# Patient Record
Sex: Male | Born: 1955 | Race: White | Hispanic: No | Marital: Married | Smoking: Former smoker
Health system: Southern US, Community
[De-identification: ages and names within clinical notes are randomized; demographics above are authoritative.]

## PROBLEM LIST (undated history)

## (undated) DIAGNOSIS — G709 Myoneural disorder, unspecified: Secondary | ICD-10-CM

## (undated) DIAGNOSIS — R112 Nausea with vomiting, unspecified: Secondary | ICD-10-CM

## (undated) DIAGNOSIS — G4730 Sleep apnea, unspecified: Secondary | ICD-10-CM

## (undated) DIAGNOSIS — E119 Type 2 diabetes mellitus without complications: Secondary | ICD-10-CM

## (undated) DIAGNOSIS — Z9889 Other specified postprocedural states: Secondary | ICD-10-CM

## (undated) DIAGNOSIS — M1990 Unspecified osteoarthritis, unspecified site: Secondary | ICD-10-CM

## (undated) DIAGNOSIS — I10 Essential (primary) hypertension: Secondary | ICD-10-CM

## (undated) DIAGNOSIS — K219 Gastro-esophageal reflux disease without esophagitis: Secondary | ICD-10-CM

## (undated) DIAGNOSIS — Z8489 Family history of other specified conditions: Secondary | ICD-10-CM

## (undated) HISTORY — PX: OTHER SURGICAL HISTORY: SHX169

## (undated) HISTORY — PX: BACK SURGERY: SHX140

## (undated) HISTORY — PX: HIP SURGERY: SHX245

---

## 2001-01-21 HISTORY — PX: CHOLECYSTECTOMY: SHX55

## 2002-01-21 HISTORY — PX: NECK SURGERY: SHX720

## 2002-01-21 HISTORY — PX: WRIST SURGERY: SHX841

## 2003-01-22 HISTORY — PX: OTHER SURGICAL HISTORY: SHX169

## 2015-06-21 ENCOUNTER — Other Ambulatory Visit: Payer: Self-pay | Admitting: Neurological Surgery

## 2015-06-21 DIAGNOSIS — M542 Cervicalgia: Secondary | ICD-10-CM

## 2015-06-21 DIAGNOSIS — M545 Low back pain: Secondary | ICD-10-CM

## 2015-06-29 ENCOUNTER — Ambulatory Visit
Admission: RE | Admit: 2015-06-29 | Discharge: 2015-06-29 | Disposition: A | Discharge status: Home / Self Care | Payer: Self-pay | Source: Ambulatory Visit | Attending: Neurological Surgery | Admitting: Neurological Surgery

## 2015-06-29 ENCOUNTER — Ambulatory Visit
Admission: RE | Admit: 2015-06-29 | Discharge: 2015-06-29 | Disposition: A | Discharge status: Home / Self Care | Payer: Medicare HMO | Source: Ambulatory Visit | Attending: Neurological Surgery | Admitting: Neurological Surgery

## 2015-06-29 DIAGNOSIS — M542 Cervicalgia: Secondary | ICD-10-CM

## 2015-06-29 DIAGNOSIS — M545 Low back pain: Secondary | ICD-10-CM

## 2015-06-29 MED ORDER — GADOBENATE DIMEGLUMINE 529 MG/ML IV SOLN
20.0000 mL | Freq: Once | INTRAVENOUS | Status: AC | PRN
  Administered 2015-06-29: 20 mL via INTRAVENOUS

## 2016-02-02 ENCOUNTER — Other Ambulatory Visit: Payer: Self-pay | Admitting: Neurological Surgery

## 2016-03-18 NOTE — Pre-Procedure Instructions (Signed)
    Tasia CatchingsJohnny B Huxtable  03/18/2016      [facility] - [place] - [id number] [place] [id number] Phone: [phone] Fax: [phone]  [facility] [place] - [address] [place] [id number] Phone: [phone] Fax: [phone]    Your procedure is scheduled on Wed, Mar 7 @ 8:30 AM  [facility] Admitting at 6:30 AM  Call this number if you have problems the morning of surgery:  [phone]   Remember:  Do not eat food or drink liquids after midnight.  Take these medicines the morning of surgery with A SIP OF WATER Amlodipine(Norvasc),Gabapentin(Neurontin),Pain Pill(if needed),and Omeprazole(Prilosec)                Stop taking your Mobic along with any Vitamins or Herbal Medications. No Goody's,BC's,Aleve,Advil,Motrin,Ibuprofen,or Fish Oil.    Do not wear jewelry.  Do not wear lotions, powders,colognes, or deoderant.             Men may shave face and neck.  Do not bring valuables to the hospital.  [facility] is not responsible for any belongings or valuables.  Contacts, dentures or bridgework may not be worn into surgery.  Leave your suitcase in the car.  After surgery it may be brought to your room.  For patients admitted to the hospital, discharge time will be determined by your treatment team.  Patients discharged the day of surgery will not be allowed to drive home.    Special instructions:    Please read over the following fact sheets that you were given. Pain Booklet, Coughing and Deep Breathing, MRSA Information and Surgical Site Infection Prevention

## 2016-03-19 ENCOUNTER — Encounter (HOSPITAL_COMMUNITY)
Admission: RE | Admit: 2016-03-19 | Discharge: 2016-03-19 | Disposition: A | Discharge status: Home / Self Care | Payer: Medicare HMO | Source: Ambulatory Visit | Attending: Neurological Surgery | Admitting: Neurological Surgery

## 2016-03-19 ENCOUNTER — Encounter (HOSPITAL_COMMUNITY): Payer: Self-pay

## 2016-03-19 DIAGNOSIS — M4802 Spinal stenosis, cervical region: Secondary | ICD-10-CM | POA: Insufficient documentation

## 2016-03-19 DIAGNOSIS — Z01818 Encounter for other preprocedural examination: Secondary | ICD-10-CM | POA: Insufficient documentation

## 2016-03-19 HISTORY — DX: Essential (primary) hypertension: I10

## 2016-03-19 HISTORY — DX: Sleep apnea, unspecified: G47.30

## 2016-03-19 HISTORY — DX: Unspecified osteoarthritis, unspecified site: M19.90

## 2016-03-19 HISTORY — DX: Nausea with vomiting, unspecified: R11.2

## 2016-03-19 HISTORY — DX: Gastro-esophageal reflux disease without esophagitis: K21.9

## 2016-03-19 HISTORY — DX: Other specified postprocedural states: Z98.890

## 2016-03-19 LAB — CBC WITH DIFFERENTIAL/PLATELET
BASOS ABS: 0 10*3/uL (ref 0.0–0.1)
BASOS PCT: 0 %
Eosinophils Absolute: 0.2 10*3/uL (ref 0.0–0.7)
Eosinophils Relative: 2 %
HEMATOCRIT: 42.1 % (ref 39.0–52.0)
HEMOGLOBIN: 14.5 g/dL (ref 13.0–17.0)
LYMPHS PCT: 36 %
Lymphs Abs: 2.9 10*3/uL (ref 0.7–4.0)
MCH: 29.4 pg (ref 26.0–34.0)
MCHC: 34.4 g/dL (ref 30.0–36.0)
MCV: 85.4 fL (ref 78.0–100.0)
Monocytes Absolute: 0.5 10*3/uL (ref 0.1–1.0)
Monocytes Relative: 6 %
NEUTROS ABS: 4.4 10*3/uL (ref 1.7–7.7)
NEUTROS PCT: 56 %
Platelets: 285 10*3/uL (ref 150–400)
RBC: 4.93 MIL/uL (ref 4.22–5.81)
RDW: 13.7 % (ref 11.5–15.5)
WBC: 8.1 10*3/uL (ref 4.0–10.5)

## 2016-03-19 LAB — SURGICAL PCR SCREEN
MRSA, PCR: NEGATIVE
Staphylococcus aureus: NEGATIVE

## 2016-03-19 LAB — BASIC METABOLIC PANEL
ANION GAP: 10 (ref 5–15)
BUN: 11 mg/dL (ref 6–20)
CALCIUM: 9.4 mg/dL (ref 8.9–10.3)
CO2: 25 mmol/L (ref 22–32)
Chloride: 103 mmol/L (ref 101–111)
Creatinine, Ser: 0.76 mg/dL (ref 0.61–1.24)
Glucose, Bld: 150 mg/dL — ABNORMAL HIGH (ref 65–99)
POTASSIUM: 3.9 mmol/L (ref 3.5–5.1)
Sodium: 138 mmol/L (ref 135–145)

## 2016-03-19 LAB — PROTIME-INR
INR: 1
Prothrombin Time: 13.2 seconds (ref 11.4–15.2)

## 2016-03-19 MED ORDER — CHLORHEXIDINE GLUCONATE CLOTH 2 % EX PADS: 6.0000 | MEDICATED_PAD | Freq: Once | CUTANEOUS | Status: DC

## 2016-03-19 NOTE — Progress Notes (Signed)
PCP: Dr. Margaree MackintoshJohn McKinney  Cardiologist: pt denies  ECG: pt denies past year  Stress test: 2016, in EPIC  Cardiac Cath: pt denies  Chest x-ray: pt denies past year

## 2016-03-26 MED ORDER — DEXAMETHASONE SODIUM PHOSPHATE 10 MG/ML IJ SOLN
10.0000 mg | INTRAMUSCULAR | Status: AC
  Administered 2016-03-27: 10 mg via INTRAVENOUS
  Filled 2016-03-26: qty 1

## 2016-03-26 MED ORDER — DEXTROSE 5 % IV SOLN
3.0000 g | INTRAVENOUS | Status: AC
  Administered 2016-03-27: 3 g via INTRAVENOUS
  Filled 2016-03-26: qty 3000

## 2016-03-27 ENCOUNTER — Observation Stay (HOSPITAL_COMMUNITY)
Admission: RE | Admit: 2016-03-27 | Discharge: 2016-03-28 | Disposition: A | Discharge status: Home / Self Care | Payer: Medicare HMO | Source: Ambulatory Visit | Attending: Neurological Surgery | Admitting: Neurological Surgery

## 2016-03-27 ENCOUNTER — Inpatient Hospital Stay (HOSPITAL_COMMUNITY): Payer: Medicare HMO | Admitting: Anesthesiology

## 2016-03-27 ENCOUNTER — Encounter (HOSPITAL_COMMUNITY): Payer: Self-pay | Admitting: Neurological Surgery

## 2016-03-27 ENCOUNTER — Encounter (HOSPITAL_COMMUNITY)
Admission: RE | Disposition: A | Discharge status: Home / Self Care | Payer: Self-pay | Source: Ambulatory Visit | Attending: Neurological Surgery

## 2016-03-27 ENCOUNTER — Inpatient Hospital Stay (HOSPITAL_COMMUNITY): Payer: Medicare HMO

## 2016-03-27 DIAGNOSIS — Z79899 Other long term (current) drug therapy: Secondary | ICD-10-CM | POA: Insufficient documentation

## 2016-03-27 DIAGNOSIS — Z7982 Long term (current) use of aspirin: Secondary | ICD-10-CM | POA: Diagnosis not present

## 2016-03-27 DIAGNOSIS — Z87891 Personal history of nicotine dependence: Secondary | ICD-10-CM | POA: Diagnosis not present

## 2016-03-27 DIAGNOSIS — G4730 Sleep apnea, unspecified: Secondary | ICD-10-CM | POA: Diagnosis not present

## 2016-03-27 DIAGNOSIS — M1990 Unspecified osteoarthritis, unspecified site: Secondary | ICD-10-CM | POA: Insufficient documentation

## 2016-03-27 DIAGNOSIS — Z419 Encounter for procedure for purposes other than remedying health state, unspecified: Secondary | ICD-10-CM

## 2016-03-27 DIAGNOSIS — K219 Gastro-esophageal reflux disease without esophagitis: Secondary | ICD-10-CM | POA: Diagnosis not present

## 2016-03-27 DIAGNOSIS — M4322 Fusion of spine, cervical region: Secondary | ICD-10-CM | POA: Diagnosis present

## 2016-03-27 DIAGNOSIS — M4802 Spinal stenosis, cervical region: Principal | ICD-10-CM | POA: Insufficient documentation

## 2016-03-27 HISTORY — PX: ANTERIOR CERVICAL DECOMP/DISCECTOMY FUSION: SHX1161

## 2016-03-27 SURGERY — ANTERIOR CERVICAL DECOMPRESSION/DISCECTOMY FUSION 2 LEVELS
Anesthesia: General | Site: Spine Cervical

## 2016-03-27 MED ORDER — HYDROCODONE-ACETAMINOPHEN 7.5-325 MG PO TABS
1.0000 | ORAL_TABLET | ORAL | Status: DC | PRN
  Administered 2016-03-27 (×3): 1 via ORAL
  Administered 2016-03-28 (×3): 2 via ORAL
  Filled 2016-03-27: qty 2
  Filled 2016-03-27: qty 1
  Filled 2016-03-27: qty 2
  Filled 2016-03-27 (×4): qty 1

## 2016-03-27 MED ORDER — PHENYLEPHRINE 40 MCG/ML (10ML) SYRINGE FOR IV PUSH (FOR BLOOD PRESSURE SUPPORT)
PREFILLED_SYRINGE | INTRAVENOUS | Status: AC
  Filled 2016-03-27: qty 10

## 2016-03-27 MED ORDER — 0.9 % SODIUM CHLORIDE (POUR BTL) OPTIME
TOPICAL | Status: DC | PRN
  Administered 2016-03-27: 1000 mL

## 2016-03-27 MED ORDER — MIDAZOLAM HCL 5 MG/5ML IJ SOLN
INTRAMUSCULAR | Status: DC | PRN
  Administered 2016-03-27: 2 mg via INTRAVENOUS

## 2016-03-27 MED ORDER — SODIUM CHLORIDE 0.9% FLUSH
3.0000 mL | INTRAVENOUS | Status: DC | PRN
Start: 2016-03-27 — End: 2016-03-28

## 2016-03-27 MED ORDER — ACETAMINOPHEN 10 MG/ML IV SOLN
INTRAVENOUS | Status: DC | PRN
  Administered 2016-03-27: 1000 mg via INTRAVENOUS

## 2016-03-27 MED ORDER — LACTATED RINGERS IV SOLN
INTRAVENOUS | Status: DC | PRN
  Administered 2016-03-27 (×2): via INTRAVENOUS

## 2016-03-27 MED ORDER — THROMBIN 5000 UNITS EX SOLR
CUTANEOUS | Status: DC | PRN
  Administered 2016-03-27 (×2): 5000 [IU] via TOPICAL

## 2016-03-27 MED ORDER — BUPIVACAINE HCL (PF) 0.25 % IJ SOLN
INTRAMUSCULAR | Status: AC
  Filled 2016-03-27: qty 30

## 2016-03-27 MED ORDER — ONDANSETRON HCL 4 MG/2ML IJ SOLN: 4.0000 mg | Freq: Once | INTRAMUSCULAR | Status: DC | PRN

## 2016-03-27 MED ORDER — ACETAMINOPHEN 325 MG PO TABS
650.0000 mg | ORAL_TABLET | ORAL | Status: DC | PRN
Start: 2016-03-27 — End: 2016-03-28

## 2016-03-27 MED ORDER — MENTHOL 3 MG MT LOZG: 1.0000 | LOZENGE | OROMUCOSAL | Status: DC | PRN

## 2016-03-27 MED ORDER — DEXAMETHASONE SODIUM PHOSPHATE 10 MG/ML IJ SOLN
INTRAMUSCULAR | Status: AC
  Filled 2016-03-27: qty 1

## 2016-03-27 MED ORDER — GABAPENTIN 400 MG PO CAPS
800.0000 mg | ORAL_CAPSULE | Freq: Four times a day (QID) | ORAL | Status: DC
  Administered 2016-03-27 – 2016-03-28 (×4): 800 mg via ORAL
  Filled 2016-03-27 (×4): qty 2

## 2016-03-27 MED ORDER — LIDOCAINE 2% (20 MG/ML) 5 ML SYRINGE
INTRAMUSCULAR | Status: DC | PRN
  Administered 2016-03-27: 80 mg via INTRAVENOUS

## 2016-03-27 MED ORDER — THROMBIN 5000 UNITS EX SOLR
CUTANEOUS | Status: AC
  Filled 2016-03-27: qty 15000

## 2016-03-27 MED ORDER — EPHEDRINE SULFATE-NACL 50-0.9 MG/10ML-% IV SOSY
PREFILLED_SYRINGE | INTRAVENOUS | Status: DC | PRN
  Administered 2016-03-27: 5 mg via INTRAVENOUS
  Administered 2016-03-27: 10 mg via INTRAVENOUS

## 2016-03-27 MED ORDER — SUCCINYLCHOLINE CHLORIDE 200 MG/10ML IV SOSY
PREFILLED_SYRINGE | INTRAVENOUS | Status: AC
  Filled 2016-03-27: qty 10

## 2016-03-27 MED ORDER — CYCLOBENZAPRINE HCL 10 MG PO TABS
10.0000 mg | ORAL_TABLET | Freq: Three times a day (TID) | ORAL | Status: DC | PRN
  Administered 2016-03-27 (×2): 10 mg via ORAL
  Filled 2016-03-27 (×2): qty 1

## 2016-03-27 MED ORDER — FENTANYL CITRATE (PF) 100 MCG/2ML IJ SOLN
INTRAMUSCULAR | Status: DC | PRN
  Administered 2016-03-27: 100 ug via INTRAVENOUS
  Administered 2016-03-27: 50 ug via INTRAVENOUS

## 2016-03-27 MED ORDER — PROPOFOL 10 MG/ML IV BOLUS
INTRAVENOUS | Status: DC | PRN
  Administered 2016-03-27: 200 mg via INTRAVENOUS

## 2016-03-27 MED ORDER — LIDOCAINE 2% (20 MG/ML) 5 ML SYRINGE
INTRAMUSCULAR | Status: AC
  Filled 2016-03-27: qty 5

## 2016-03-27 MED ORDER — FENTANYL CITRATE (PF) 100 MCG/2ML IJ SOLN
INTRAMUSCULAR | Status: AC
  Filled 2016-03-27: qty 2

## 2016-03-27 MED ORDER — PROPOFOL 10 MG/ML IV BOLUS
INTRAVENOUS | Status: AC
Start: 2016-03-27 — End: 2016-03-27
  Filled 2016-03-27: qty 20

## 2016-03-27 MED ORDER — PHENOL 1.4 % MT LIQD
1.0000 | OROMUCOSAL | Status: DC | PRN
  Filled 2016-03-27: qty 177

## 2016-03-27 MED ORDER — MORPHINE SULFATE (PF) 4 MG/ML IV SOLN: 2.0000 mg | INTRAVENOUS | Status: DC | PRN

## 2016-03-27 MED ORDER — POTASSIUM CHLORIDE IN NACL 20-0.9 MEQ/L-% IV SOLN: INTRAVENOUS | Status: DC

## 2016-03-27 MED ORDER — PHENYLEPHRINE 40 MCG/ML (10ML) SYRINGE FOR IV PUSH (FOR BLOOD PRESSURE SUPPORT)
PREFILLED_SYRINGE | INTRAVENOUS | Status: DC | PRN
  Administered 2016-03-27 (×3): 80 ug via INTRAVENOUS

## 2016-03-27 MED ORDER — SODIUM CHLORIDE 0.9% FLUSH
3.0000 mL | Freq: Two times a day (BID) | INTRAVENOUS | Status: DC
  Administered 2016-03-27 (×2): 3 mL via INTRAVENOUS

## 2016-03-27 MED ORDER — BUPIVACAINE HCL (PF) 0.25 % IJ SOLN
INTRAMUSCULAR | Status: DC | PRN
  Administered 2016-03-27: 7 mL

## 2016-03-27 MED ORDER — EPHEDRINE 5 MG/ML INJ
INTRAVENOUS | Status: AC
  Filled 2016-03-27: qty 10

## 2016-03-27 MED ORDER — ROCURONIUM BROMIDE 10 MG/ML (PF) SYRINGE
PREFILLED_SYRINGE | INTRAVENOUS | Status: DC | PRN
  Administered 2016-03-27: 50 mg via INTRAVENOUS
  Administered 2016-03-27: 10 mg via INTRAVENOUS

## 2016-03-27 MED ORDER — SODIUM CHLORIDE 0.9 % IR SOLN
Status: DC | PRN
  Administered 2016-03-27: 09:00:00

## 2016-03-27 MED ORDER — MIDAZOLAM HCL 2 MG/2ML IJ SOLN
INTRAMUSCULAR | Status: AC
  Filled 2016-03-27: qty 2

## 2016-03-27 MED ORDER — CEFAZOLIN SODIUM-DEXTROSE 2-4 GM/100ML-% IV SOLN
2.0000 g | Freq: Three times a day (TID) | INTRAVENOUS | Status: AC
  Administered 2016-03-27 (×2): 2 g via INTRAVENOUS
  Filled 2016-03-27 (×2): qty 100

## 2016-03-27 MED ORDER — ROCURONIUM BROMIDE 50 MG/5ML IV SOSY
PREFILLED_SYRINGE | INTRAVENOUS | Status: AC
Start: 2016-03-27 — End: 2016-03-27
  Filled 2016-03-27: qty 5

## 2016-03-27 MED ORDER — ONDANSETRON HCL 4 MG/2ML IJ SOLN
INTRAMUSCULAR | Status: DC | PRN
  Administered 2016-03-27: 4 mg via INTRAVENOUS

## 2016-03-27 MED ORDER — GABAPENTIN 800 MG PO TABS
800.0000 mg | ORAL_TABLET | Freq: Four times a day (QID) | ORAL | Status: DC
  Filled 2016-03-27 (×2): qty 1

## 2016-03-27 MED ORDER — ACETAMINOPHEN 650 MG RE SUPP: 650.0000 mg | RECTAL | Status: DC | PRN

## 2016-03-27 MED ORDER — ACETAMINOPHEN 10 MG/ML IV SOLN
INTRAVENOUS | Status: AC
  Filled 2016-03-27: qty 100

## 2016-03-27 MED ORDER — QUINAPRIL HCL 10 MG PO TABS
40.0000 mg | ORAL_TABLET | Freq: Every day | ORAL | Status: DC
  Administered 2016-03-28: 40 mg via ORAL
  Filled 2016-03-27: qty 4

## 2016-03-27 MED ORDER — ESCITALOPRAM OXALATE 20 MG PO TABS
20.0000 mg | ORAL_TABLET | Freq: Every day | ORAL | Status: DC
  Administered 2016-03-27: 20 mg via ORAL
  Filled 2016-03-27 (×2): qty 1

## 2016-03-27 MED ORDER — AMLODIPINE BESYLATE 5 MG PO TABS
5.0000 mg | ORAL_TABLET | Freq: Every day | ORAL | Status: DC
  Administered 2016-03-27: 5 mg via ORAL
  Filled 2016-03-27: qty 1

## 2016-03-27 MED ORDER — ONDANSETRON HCL 4 MG PO TABS: 4.0000 mg | ORAL_TABLET | Freq: Four times a day (QID) | ORAL | Status: DC | PRN

## 2016-03-27 MED ORDER — FENTANYL CITRATE (PF) 100 MCG/2ML IJ SOLN
25.0000 ug | INTRAMUSCULAR | Status: DC | PRN
  Administered 2016-03-27 (×2): 50 ug via INTRAVENOUS

## 2016-03-27 MED ORDER — HYDROCHLOROTHIAZIDE 25 MG PO TABS
25.0000 mg | ORAL_TABLET | Freq: Every day | ORAL | Status: DC
  Administered 2016-03-28: 25 mg via ORAL
  Filled 2016-03-27: qty 1

## 2016-03-27 MED ORDER — PANTOPRAZOLE SODIUM 40 MG PO TBEC
40.0000 mg | DELAYED_RELEASE_TABLET | Freq: Every day | ORAL | Status: DC
  Administered 2016-03-27 – 2016-03-28 (×2): 40 mg via ORAL
  Filled 2016-03-27 (×2): qty 1

## 2016-03-27 MED ORDER — ONDANSETRON HCL 4 MG/2ML IJ SOLN: 4.0000 mg | Freq: Four times a day (QID) | INTRAMUSCULAR | Status: DC | PRN

## 2016-03-27 MED ORDER — HEMOSTATIC AGENTS (NO CHARGE) OPTIME
TOPICAL | Status: DC | PRN
  Administered 2016-03-27: 1 via TOPICAL

## 2016-03-27 MED ORDER — SENNA 8.6 MG PO TABS
1.0000 | ORAL_TABLET | Freq: Two times a day (BID) | ORAL | Status: DC
  Administered 2016-03-27 – 2016-03-28 (×3): 8.6 mg via ORAL
  Filled 2016-03-27 (×3): qty 1

## 2016-03-27 MED ORDER — THROMBIN 5000 UNITS EX SOLR
OROMUCOSAL | Status: DC | PRN
  Administered 2016-03-27: 09:00:00 via TOPICAL

## 2016-03-27 MED ORDER — SUGAMMADEX SODIUM 200 MG/2ML IV SOLN
INTRAVENOUS | Status: DC | PRN
  Administered 2016-03-27: 300 mg via INTRAVENOUS

## 2016-03-27 SURGICAL SUPPLY — 52 items
BAG DECANTER FOR FLEXI CONT (MISCELLANEOUS) ×2 IMPLANT
BENZOIN TINCTURE PRP APPL 2/3 (GAUZE/BANDAGES/DRESSINGS) ×2 IMPLANT
BIT DRILL POWER (BIT) ×1 IMPLANT
BUR MATCHSTICK NEURO 3.0 LAGG (BURR) ×2 IMPLANT
CAGE 14X16X9 CERVICAL COHERE 7 (Cage) ×2 IMPLANT
CAGE CERV COHERE 14X16X8 7DE (Cage) ×2 IMPLANT
CANISTER SUCT 3000ML PPV (MISCELLANEOUS) ×2 IMPLANT
CARTRIDGE OIL MAESTRO DRILL (MISCELLANEOUS) ×1 IMPLANT
DIFFUSER DRILL AIR PNEUMATIC (MISCELLANEOUS) ×2 IMPLANT
DRAPE C-ARM 42X72 X-RAY (DRAPES) ×4 IMPLANT
DRAPE LAPAROTOMY 100X72 PEDS (DRAPES) ×2 IMPLANT
DRAPE MICROSCOPE LEICA (MISCELLANEOUS) ×2 IMPLANT
DRAPE POUCH INSTRU U-SHP 10X18 (DRAPES) ×2 IMPLANT
DRILL BIT POWER (BIT) ×1
DRSG OPSITE POSTOP 4X6 (GAUZE/BANDAGES/DRESSINGS) ×2 IMPLANT
DURAPREP 6ML APPLICATOR 50/CS (WOUND CARE) ×2 IMPLANT
ELECT COATED BLADE 2.86 ST (ELECTRODE) ×2 IMPLANT
ELECT REM PT RETURN 9FT ADLT (ELECTROSURGICAL) ×2
ELECTRODE REM PT RTRN 9FT ADLT (ELECTROSURGICAL) ×1 IMPLANT
GAUZE SPONGE 4X4 16PLY XRAY LF (GAUZE/BANDAGES/DRESSINGS) IMPLANT
GLOVE BIO SURGEON STRL SZ7 (GLOVE) ×2 IMPLANT
GLOVE BIO SURGEON STRL SZ7.5 (GLOVE) ×2 IMPLANT
GLOVE BIO SURGEON STRL SZ8 (GLOVE) ×2 IMPLANT
GLOVE BIOGEL PI IND STRL 7.0 (GLOVE) ×1 IMPLANT
GLOVE BIOGEL PI INDICATOR 7.0 (GLOVE) ×1
GOWN STRL REUS W/ TWL LRG LVL3 (GOWN DISPOSABLE) ×1 IMPLANT
GOWN STRL REUS W/ TWL XL LVL3 (GOWN DISPOSABLE) ×2 IMPLANT
GOWN STRL REUS W/TWL 2XL LVL3 (GOWN DISPOSABLE) IMPLANT
GOWN STRL REUS W/TWL LRG LVL3 (GOWN DISPOSABLE) ×1
GOWN STRL REUS W/TWL XL LVL3 (GOWN DISPOSABLE) ×2
HEMOSTAT POWDER KIT SURGIFOAM (HEMOSTASIS) ×2 IMPLANT
KIT BASIN OR (CUSTOM PROCEDURE TRAY) ×2 IMPLANT
KIT ROOM TURNOVER OR (KITS) ×2 IMPLANT
NEEDLE HYPO 25X1 1.5 SAFETY (NEEDLE) ×2 IMPLANT
NEEDLE SPNL 20GX3.5 QUINCKE YW (NEEDLE) ×2 IMPLANT
NS IRRIG 1000ML POUR BTL (IV SOLUTION) ×2 IMPLANT
OIL CARTRIDGE MAESTRO DRILL (MISCELLANEOUS) ×2
PACK LAMINECTOMY NEURO (CUSTOM PROCEDURE TRAY) ×2 IMPLANT
PAD ARMBOARD 7.5X6 YLW CONV (MISCELLANEOUS) ×6 IMPLANT
PIN DISTRACTION 14MM (PIN) ×4 IMPLANT
PLATE ARCHON 1-LEVEL 26MM (Plate) ×2 IMPLANT
RUBBERBAND STERILE (MISCELLANEOUS) ×4 IMPLANT
SCREW ARCHON ST VAR 4.0X15MM (Screw) ×8 IMPLANT
SCREW BN 15X4XST VA NS SPN (Screw) ×8 IMPLANT
SPONGE INTESTINAL PEANUT (DISPOSABLE) ×2 IMPLANT
SPONGE SURGIFOAM ABS GEL SZ50 (HEMOSTASIS) ×2 IMPLANT
STRIP CLOSURE SKIN 1/2X4 (GAUZE/BANDAGES/DRESSINGS) ×2 IMPLANT
SUT VIC AB 3-0 SH 8-18 (SUTURE) ×4 IMPLANT
TOWEL GREEN STERILE (TOWEL DISPOSABLE) ×2 IMPLANT
TOWEL GREEN STERILE FF (TOWEL DISPOSABLE) ×2 IMPLANT
TRAP SPECIMEN MUCOUS 40CC (MISCELLANEOUS) ×2 IMPLANT
WATER STERILE IRR 1000ML POUR (IV SOLUTION) ×2 IMPLANT

## 2016-03-27 NOTE — Anesthesia Preprocedure Evaluation (Addendum)
Anesthesia Evaluation  Patient identified by MRN, date of birth, ID band Patient awake    Reviewed: Allergy & Precautions, NPO status , Patient's Chart, lab work & pertinent test results  Airway Mallampati: II  TM Distance: >3 FB Neck ROM: Full    Dental  (+) Teeth Intact, Dental Advisory Given   Pulmonary former smoker,    breath sounds clear to auscultation       Cardiovascular hypertension,  Rhythm:Regular Rate:Normal     Neuro/Psych    GI/Hepatic   Endo/Other    Renal/GU      Musculoskeletal   Abdominal (+) + obese,   Peds  Hematology   Anesthesia Other Findings   Reproductive/Obstetrics                           Anesthesia Physical Anesthesia Plan  ASA: III  Anesthesia Plan: General   Post-op Pain Management:    Induction: Intravenous  Airway Management Planned: Oral ETT  Additional Equipment:   Intra-op Plan:   Post-operative Plan: Extubation in OR  Informed Consent: I have reviewed the patients History and Physical, chart, labs and discussed the procedure including the risks, benefits and alternatives for the proposed anesthesia with the patient or authorized representative who has indicated his/her understanding and acceptance.   Dental advisory given  Plan Discussed with: CRNA and Anesthesiologist  Anesthesia Plan Comments:        Anesthesia Quick Evaluation  

## 2016-03-27 NOTE — Transfer of Care (Signed)
Immediate Anesthesia Transfer of Care Note  Patient: Tasia CatchingsJohnny B Kalbfleisch  Procedure(s) Performed: Procedure(s): CERVICAL FOUR-FIVE, CERVICAL SIX, SEVEN ANTERIOR CERVICAL DECOMPRESSION/DISCECTOMY FUSION (N/A)  Patient Location: PACU  Anesthesia Type:General  Level of Consciousness: awake, alert , oriented and patient cooperative  Airway & Oxygen Therapy: Patient Spontanous Breathing and Patient connected to nasal cannula oxygen  Post-op Assessment: Report given to RN, Post -op Vital signs reviewed and stable and Patient moving all extremities  Post vital signs: Reviewed and stable  Last Vitals:  Vitals:   03/27/16 0725 03/27/16 1123  BP: (!) 152/85   Pulse: 64   Resp: 20   Temp: 36.7 C (P) 37.1 C    Last Pain:  Vitals:   03/27/16 1123  TempSrc:   PainSc: (P) Asleep         Complications: No apparent anesthesia complications

## 2016-03-27 NOTE — Op Note (Signed)
03/27/2016  11:20 AM  PATIENT:  Martin Holt  61 y.o. 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 Decompressive anterior cervical discectomy C4-5, C6-7, 2. Anterior cervical arthrodesis C4-5, C6-7 utilizing  peek interbody cages packed with locally harvested morcellized autologous bone graft, 3. Anterior cervical plating C4-5, C6-7 (separate plates at each level) utilizing a nuvasive plate  SURGEON:  Marikay Alar, MD  ASSISTANTS: Dr Lovell Sheehan  ANESTHESIA:   General  EBL: 50 ml  Total I/O In: 1000 [I.V.:1000] Out: 150 [Blood:150]  BLOOD ADMINISTERED: none  DRAINS: none  SPECIMEN:  none  INDICATION FOR PROCEDURE: This patient presented with neck and arm pain. Imaging showed stenosis C4-5 C6-7 adjacent o C5-6 ACD. The patient tried conservative measures without relief. Pain was debilitating. Recommended ACDF with plating. Patient understood the risks, benefits, and alternatives and potential outcomes and wished to proceed.  PROCEDURE DETAILS: Patient was brought to the operating room placed under general endotracheal anesthesia. Patient was placed in the supine position on the operating room table. The neck was prepped with Duraprep and draped in a sterile fashion.   Three cc of local anesthesia was injected and a transverse incision was made on the right side of the neck.  Dissection was carried down thru the subcutaneous tissue and the platysma was  elevated, opened, and undermined with Metzenbaum scissors.  Dissection was then carried out thru an avascular plane leaving the sternocleidomastoid carotid artery and jugular vein laterally and the trachea and esophagus medially. The ventral aspect of the vertebral column was identified and a localizing x-ray was taken. The C4-5 and c6-7 level was identified. The longus colli muscles were then elevated and the retractor was placed. I started at C4-5 but the same technique  was used at both levels.The annulus was incised and the disc space entered. Discectomy was performed with micro-curettes and pituitary rongeurs. I then used the high-speed drill to drill the endplates down to the level of the posterior longitudinal ligament. The drill shavings were saved in a mucous trap for later arthrodesis. The operating microscope was draped and brought into the field provided additional magnification, illumination and visualization. Discectomy was continued posteriorly thru the disc space. Posterior longitudinal ligament was opened with a nerve hook, and then removed along with disc herniation and osteophytes, decompressing the spinal canal and thecal sac. We then continued to remove osteophytic overgrowth and disc material decompressing the neural foramina and exiting nerve roots bilaterally. The scope was angled up and down to help decompress and undercut the vertebral bodies. Once the decompression was completed we could pass a nerve hook circumferentially to assure adequate decompression in the midline and in the neural foramina. So by both visualization and palpation we felt we had an adequate decompression of the neural elements. We then measured the height of the intravertebral disc space and selected a 8 millimeter Peek interbody cage packed with autograft  For C4-5 and a 9mm graft for C6-7. It was then gently positioned in the intravertebral disc space(s) and countersunk. I then used a nuvasive archon plate for each level and placed variable angle screws into the vertebral bodies of each level and locked them into position. The wound was irrigated with bacitracin solution, checked for hemostasis which was established and confirmed. Once meticulous hemostasis was achieved, we then proceeded with closure. The platysma was closed with interrupted 3-0 undyed Vicryl suture, the subcuticular layer was closed with interrupted 3-0 undyed Vicryl suture.  The skin edges were approximated with  steristrips. The drapes were removed. A sterile dressing was applied. The patient was then awakened from general anesthesia and transferred to the recovery room in stable condition. At the end of the procedure all sponge, needle and instrument counts were correct.   PLAN OF CARE: Admit for overnight observation  PATIENT DISPOSITION:  PACU - hemodynamically stable.   Delay start of Pharmacological VTE agent (>24hrs) due to surgical blood loss or risk of bleeding:  yes

## 2016-03-27 NOTE — Progress Notes (Signed)
Placed patient on CPAP for the night via auto-mode with minimum pressure set at 4cm and maximum pressure set at 20cm.  

## 2016-03-27 NOTE — Anesthesia Procedure Notes (Signed)
Procedure Name: Intubation Date/Time: 03/27/2016 8:37 AM Performed by: Myna Bright Pre-anesthesia Checklist: Patient identified, Emergency Drugs available, Suction available and Patient being monitored Patient Re-evaluated:Patient Re-evaluated prior to inductionOxygen Delivery Method: Circle system utilized Preoxygenation: Pre-oxygenation with 100% oxygen Intubation Type: IV induction Ventilation: Mask ventilation without difficulty Laryngoscope Size: Mac and 4 Grade View: Grade I Tube type: Oral Tube size: 7.5 mm Number of attempts: 1 Airway Equipment and Method: Stylet Placement Confirmation: ETT inserted through vocal cords under direct vision,  positive ETCO2 and breath sounds checked- equal and bilateral Secured at: 23 cm Tube secured with: Tape Dental Injury: Teeth and Oropharynx as per pre-operative assessment

## 2016-03-27 NOTE — Anesthesia Postprocedure Evaluation (Addendum)
Anesthesia Post Note  Patient: Martin Holt  Procedure(s) Performed: Procedure(s) (LRB): CERVICAL FOUR-FIVE, CERVICAL SIX, SEVEN ANTERIOR CERVICAL DECOMPRESSION/DISCECTOMY FUSION (N/A)  Patient location during evaluation: PACU Anesthesia Type: General Level of consciousness: awake, awake and alert and oriented Pain management: pain level controlled Vital Signs Assessment: post-procedure vital signs reviewed and stable Respiratory status: spontaneous breathing and respiratory function stable Cardiovascular status: blood pressure returned to baseline Anesthetic complications: no       Last Vitals:  Vitals:   03/27/16 1316 03/27/16 1803  BP: (!) 151/92 136/65  Pulse: 94 90  Resp: 16 18  Temp: 36.6 C 37.2 C    Last Pain:  Vitals:   03/27/16 1245  TempSrc:   PainSc: 4                  Wilba Mutz COKER

## 2016-03-27 NOTE — H&P (Signed)
Subjective:   Patient is a 61 y.o. male admitted for neck and arm pain. The patient first presented to me with complaints of neck pain, arm pain and numbness of the arm(s). Onset of symptoms was several months ago. The pain is described as aching and occurs all day. The pain is rated severe, and is located in the neck and radiates to the arms. The symptoms have been progressive. Symptoms are exacerbated by extending head backwards, and are relieved by none.  Previous work up includes MRI of cervical spine, results: spinal stenosis.  Past Medical History:  Diagnosis Date  . Arthritis   . GERD (gastroesophageal reflux disease)   . Hypertension   . PONV (postoperative nausea and vomiting)   . Sleep apnea     Past Surgical History:  Procedure Laterality Date  . BACK SURGERY  1992, 1993, 1996, 2002  . carpel tunnel  2005  . CHOLECYSTECTOMY  2003  . NECK SURGERY  2004  . WRIST SURGERY Right 2004    Allergies  Allergen Reactions  . Codeine Nausea Only    Social History  Substance Use Topics  . Smoking status: Former Smoker    Quit date: 04/05/1994  . Smokeless tobacco: Never Used  . Alcohol use No    History reviewed. No pertinent family history. Prior to Admission medications   Medication Sig Start Date End Date Taking? Authorizing Provider  amLODipine (NORVASC) 5 MG tablet Take 5 mg by mouth at bedtime.   Yes Historical Provider, MD  aspirin EC 81 MG tablet Take 81 mg by mouth daily.   Yes Historical Provider, MD  cyclobenzaprine (FLEXERIL) 10 MG tablet Take 10 mg by mouth at bedtime. May take an additional 10mg s as needed for muscle spasms   Yes Historical Provider, MD  escitalopram (LEXAPRO) 20 MG tablet Take 20 mg by mouth daily after supper.   Yes Historical Provider, MD  gabapentin (NEURONTIN) 800 MG tablet Take 800 mg by mouth 4 (four) times daily.   Yes Historical Provider, MD  hydrochlorothiazide (HYDRODIURIL) 25 MG tablet Take 25 mg by mouth daily.   Yes Historical Provider,  MD  HYDROcodone-acetaminophen (NORCO) 7.5-325 MG tablet Take 1 tablet by mouth 3 (three) times daily after meals.   Yes Historical Provider, MD  meloxicam (MOBIC) 15 MG tablet Take 15 mg by mouth daily.   Yes Historical Provider, MD  Multiple Vitamin (MULTIVITAMIN WITH MINERALS) TABS tablet Take 1 tablet by mouth daily.   Yes Historical Provider, MD  omeprazole (PRILOSEC) 40 MG capsule Take 40 mg by mouth daily. 01/18/16  Yes Historical Provider, MD  quinapril (ACCUPRIL) 40 MG tablet Take 40 mg by mouth daily.   Yes Historical Provider, MD     Review of Systems  Positive ROS: neg  All other systems have been reviewed and were otherwise negative with the exception of those mentioned in the HPI and as above.  Objective: Vital signs in last 24 hours:    General Appearance: Alert, cooperative, no distress, appears stated age Head: Normocephalic, without obvious abnormality, atraumatic Eyes: PERRL, conjunctiva/corneas clear, EOM's intact      Neck: Supple, symmetrical, trachea midline, Back: Symmetric, no curvature, ROM normal, no CVA tenderness Lungs:  respirations unlabored Heart: Regular rate and rhythm Abdomen: Soft, non-tender Extremities: Extremities normal, atraumatic, no cyanosis or edema Pulses: 2+ and symmetric all extremities Skin: Skin color, texture, turgor normal, no rashes or lesions  NEUROLOGIC:  Mental status: Alert and oriented x4, no aphasia, good attention span, fund of  knowledge and memory  Motor Exam - grossly normal Sensory Exam - grossly normal Reflexes: 1+ Coordination - grossly normal Gait - grossly normal Balance - grossly normal Cranial Nerves: I: smell Not tested  II: visual acuity  OS: nl    OD: nl  II: visual fields Full to confrontation  II: pupils Equal, round, reactive to light  III,VII: ptosis None  III,IV,VI: extraocular muscles  Full ROM  V: mastication Normal  V: facial light touch sensation  Normal  V,VII: corneal reflex  Present   VII: facial muscle function - upper  Normal  VII: facial muscle function - lower Normal  VIII: hearing Not tested  IX: soft palate elevation  Normal  IX,X: gag reflex Present  XI: trapezius strength  5/5  XI: sternocleidomastoid strength 5/5  XI: neck flexion strength  5/5  XII: tongue strength  Normal    Data Review Lab Results  Component Value Date   WBC 8.1 03/19/2016   HGB 14.5 03/19/2016   HCT 42.1 03/19/2016   MCV 85.4 03/19/2016   PLT 285 03/19/2016   Lab Results  Component Value Date   NA 138 03/19/2016   K 3.9 03/19/2016   CL 103 03/19/2016   CO2 25 03/19/2016   BUN 11 03/19/2016   CREATININE 0.76 03/19/2016   GLUCOSE 150 (H) 03/19/2016   Lab Results  Component Value Date   INR 1.00 03/19/2016    Assessment:   Cervical neck pain with herniated nucleus pulposus/ spondylosis/ stenosis at C4-5, C6-7. Patient has failed conservative therapy. Planned surgery :ACDF with plates Z6-1, W9--6  Plan:   I explained the condition and procedure to the patient and answered any questions.  Patient wishes to proceed with procedure as planned. Understands risks/ benefits/ and expected or typical outcomes.  Carry Weesner S 03/27/2016 6:52 AM

## 2016-03-28 DIAGNOSIS — M4802 Spinal stenosis, cervical region: Secondary | ICD-10-CM | POA: Diagnosis not present

## 2016-03-28 MED ORDER — HYDROCODONE-ACETAMINOPHEN 7.5-325 MG PO TABS: 1.0000 | ORAL_TABLET | ORAL | 0 refills | Status: DC | PRN

## 2016-03-28 MED ORDER — CYCLOBENZAPRINE HCL 10 MG PO TABS: 10.0000 mg | ORAL_TABLET | Freq: Three times a day (TID) | ORAL | 0 refills | Status: DC | PRN

## 2016-03-28 NOTE — Discharge Summary (Signed)
Physician Discharge Summary  Patient ID: Martin CatchingsJohnny B Pesola MRN: [id number] DOB/AGE: 03/22/1955 61 y.o.  Admit date: 03/27/2016 Discharge date: 03/28/2016  Admission Diagnoses: cervical stenosis    Discharge Diagnoses: same   Discharged Condition: good  Hospital Course: The patient was admitted on 03/27/2016 and taken to the operating room where the patient underwent ACDF. The patient tolerated the procedure well and was taken to the recovery room and then to the floor in stable condition. The hospital course was routine. There were no complications. The wound remained clean dry and intact. Pt had appropriate neck soreness. No complaints of arm pain or new N/T/W. The patient remained afebrile with stable vital signs, and tolerated a regular diet. The patient continued to increase activities, and pain was well controlled with oral pain medications.   Consults: None  Significant Diagnostic Studies:  Results for orders placed or performed during the hospital encounter of 03/19/16  Surgical pcr screen  Result Value Ref Range   MRSA, PCR NEGATIVE NEGATIVE   Staphylococcus aureus NEGATIVE NEGATIVE  Basic metabolic panel  Result Value Ref Range   Sodium 138 135 - 145 mmol/L   Potassium 3.9 3.5 - 5.1 mmol/L   Chloride 103 101 - 111 mmol/L   CO2 25 22 - 32 mmol/L   Glucose, Bld 150 (H) 65 - 99 mg/dL   BUN 11 6 - 20 mg/dL   Creatinine, Ser 4.090.76 0.61 - 1.24 mg/dL   Calcium 9.4 8.9 - 81.110.3 mg/dL   GFR calc non Af Amer >60 >60 mL/min   GFR calc Af Amer >60 >60 mL/min   Anion gap 10 5 - 15  CBC WITH DIFFERENTIAL  Result Value Ref Range   WBC 8.1 4.0 - 10.5 K/uL   RBC 4.93 4.22 - 5.81 MIL/uL   Hemoglobin 14.5 13.0 - 17.0 g/dL   HCT 91.442.1 78.239.0 - 95.652.0 %   MCV 85.4 78.0 - 100.0 fL   MCH 29.4 26.0 - 34.0 pg   MCHC 34.4 30.0 - 36.0 g/dL   RDW 21.313.7 08.611.5 - 57.815.5 %   Platelets 285 150 - 400 K/uL   Neutrophils Relative % 56 %   Neutro Abs 4.4 1.7 - 7.7 K/uL   Lymphocytes Relative 36 %   Lymphs  Abs 2.9 0.7 - 4.0 K/uL   Monocytes Relative 6 %   Monocytes Absolute 0.5 0.1 - 1.0 K/uL   Eosinophils Relative 2 %   Eosinophils Absolute 0.2 0.0 - 0.7 K/uL   Basophils Relative 0 %   Basophils Absolute 0.0 0.0 - 0.1 K/uL  Protime-INR  Result Value Ref Range   Prothrombin Time 13.2 11.4 - 15.2 seconds   INR 1.00     Dg Cervical Spine 2-3 Views  Result Date: 03/27/2016 CLINICAL DATA:  Operative imaging for anterior cervical disc fusion. EXAM: DG C-ARM 61-120 MIN; CERVICAL SPINE - 2-3 VIEW COMPARISON:  Cervical MRI, 06/29/2015 FINDINGS: The 2 images show an anterior fusion plate with a radiolucent disc spacer at the C4-C5 level. The orthopedic hardware is well-seated and aligned, There is also an anterior fusion plate at C6 and C7 with an intervertebral cage. An intervertebral cage lies between the C5 and C6 vertebra. The orthopedic hardware appears well positioned in well-seated. IMPRESSION: Operative imaging during anterior cervical disc fusion as described. Electronically Signed   By: Amie Portlandavid  Ormond M.D.   On: 03/27/2016 11:20   Dg C-arm 1-60 Min  Result Date: 03/27/2016 CLINICAL DATA:  Operative imaging for anterior cervical disc fusion. EXAM:  DG C-ARM 61-120 MIN; CERVICAL SPINE - 2-3 VIEW COMPARISON:  Cervical MRI, 06/29/2015 FINDINGS: The 2 images show an anterior fusion plate with a radiolucent disc spacer at the C4-C5 level. The orthopedic hardware is well-seated and aligned, There is also an anterior fusion plate at C6 and C7 with an intervertebral cage. An intervertebral cage lies between the C5 and C6 vertebra. The orthopedic hardware appears well positioned in well-seated. IMPRESSION: Operative imaging during anterior cervical disc fusion as described. Electronically Signed   By: Amie Portland M.D.   On: 03/27/2016 11:20    Antibiotics:  Anti-infectives    Start     Dose/Rate Route Frequency Ordered Stop   03/27/16 1330  ceFAZolin (ANCEF) IVPB 2g/100 mL premix     2 g 200 mL/hr over  30 Minutes Intravenous Every 8 hours 03/27/16 1317 03/27/16 2219   03/27/16 0907  bacitracin 50,000 Units in sodium chloride irrigation 0.9 % 500 mL irrigation  Status:  Discontinued       As needed 03/27/16 0907 03/27/16 1120   03/27/16 0800  ceFAZolin (ANCEF) 3 g in dextrose 5 % 50 mL IVPB     3 g 130 mL/hr over 30 Minutes Intravenous To [facility] 03/26/16 1419 03/27/16 0845      Discharge Exam: Blood pressure 129/65, pulse 76, temperature 97.8 F (36.6 C), temperature source Oral, resp. rate 20, height 6\' 1"  (1.854 m), weight 131.1 kg (289 lb), SpO2 98 %. Neurologic: Grossly normal dressing dry  Discharge Medications:   Allergies as of 03/28/2016      Reactions   Codeine Nausea Only      Medication List    TAKE these medications   amLODipine 5 MG tablet Commonly known as:  NORVASC Take 5 mg by mouth at bedtime.   aspirin EC 81 MG tablet Take 81 mg by mouth daily.   cyclobenzaprine 10 MG tablet Commonly known as:  FLEXERIL Take 1 tablet (10 mg total) by mouth 3 (three) times daily as needed for muscle spasms. May take an additional 10mg s as needed for muscle spasms What changed:  when to take this  reasons to take this   escitalopram 20 MG tablet Commonly known as:  LEXAPRO Take 20 mg by mouth daily after supper.   gabapentin 800 MG tablet Commonly known as:  NEURONTIN Take 800 mg by mouth 4 (four) times daily.   hydrochlorothiazide 25 MG tablet Commonly known as:  HYDRODIURIL Take 25 mg by mouth daily.   HYDROcodone-acetaminophen 7.5-325 MG tablet Commonly known as:  NORCO Take 1 tablet by mouth every 4 (four) hours as needed for moderate pain. What changed:  when to take this  reasons to take this   meloxicam 15 MG tablet Commonly known as:  MOBIC Take 15 mg by mouth daily.   multivitamin with minerals Tabs tablet Take 1 tablet by mouth daily.   omeprazole 40 MG capsule Commonly known as:  PRILOSEC Take 40 mg by mouth daily.    quinapril 40 MG tablet Commonly known as:  ACCUPRIL Take 40 mg by mouth daily.       Disposition: home   Final Dx: ACDF C4-5 C6-7  Discharge Instructions    Call MD for:  difficulty breathing, headache or visual disturbances    Complete by:  As directed    Call MD for:  persistant nausea and vomiting    Complete by:  As directed    Call MD for:  redness, tenderness, or signs of infection (pain, swelling,  redness, odor or green/yellow discharge around incision site)    Complete by:  As directed    Call MD for:  severe uncontrolled pain    Complete by:  As directed    Call MD for:  temperature >100.4    Complete by:  As directed    Diet - low sodium heart healthy    Complete by:  As directed    Discharge instructions    Complete by:  As directed    No heavy lifting, no driving, no strenuous activity, may shower   Increase activity slowly    Complete by:  As directed    Remove dressing in 48 hours    Complete by:  As directed       Follow-up Information    JONES,DAVID S, MD. Schedule an appointment as soon as possible for a visit in 2 week(s).   Specialty:  Neurosurgery Contact information: [address] [place] [address] [phone]        Tia Alert, MD .   Specialty:  Neurosurgery Contact information: [address] [place] [address] [phone]            Signed: Tia Alert 03/28/2016, 7:36 AM

## 2016-03-29 ENCOUNTER — Encounter (HOSPITAL_COMMUNITY): Payer: Self-pay | Admitting: Neurological Surgery

## 2016-09-12 NOTE — Addendum Note (Signed)
Addendum  created 09/12/16 1128 by Kipp Brood, MD   Sign clinical note

## 2018-06-11 ENCOUNTER — Other Ambulatory Visit: Payer: Self-pay | Admitting: Neurological Surgery

## 2018-06-11 DIAGNOSIS — M545 Low back pain, unspecified: Secondary | ICD-10-CM

## 2018-10-26 NOTE — Patient Instructions (Signed)
DUE TO COVID-19 ONLY ONE VISITOR IS ALLOWED TO COME WITH YOU AND STAY IN THE WAITING ROOM ONLY DURING PRE OP AND PROCEDURE DAY OF SURGERY. THE 1 VISITOR MAY VISIT WITH YOU AFTER SURGERY IN YOUR PRIVATE ROOM DURING VISITING HOURS ONLY!  YOU NEED TO HAVE A COVID 19 TEST ON_______ @_______ , THIS TEST MUST BE DONE BEFORE SURGERY, COME  [address], [place] , .  [facility]) ONCE YOUR COVID TEST IS COMPLETED, PLEASE BEGIN THE QUARANTINE INSTRUCTIONS AS OUTLINED IN YOUR HANDOUT.                Martin Holt  10/26/2018   Your procedure is scheduled on: 11-03-18    Report to [facility] Main  Entrance   Report to admitting at      0900 AM     Call this number if you have problems the morning of surgery [phone]    Remember:  NO SOLID FOOD AFTER MIDNIGHT THE NIGHT PRIOR TO SURGERY. NOTHING BY MOUTH EXCEPT CLEAR LIQUIDS UNTIL 0830 am. PLEASE FINISH ENSURE DRINK PER SURGEON ORDER  WHICH NEEDS TO BE COMPLETED AT    0830 am then nothing by mouth.    BRUSH YOUR TEETH MORNING OF SURGERY AND RINSE YOUR MOUTH OUT, NO CHEWING GUM CANDY OR MINTS.     Take these medicines the morning of surgery with A SIP OF WATER: zanaflex, omeprazole, wellbutrin, amlodipine, gabapentin  DO NOT TAKE ANY DIABETIC MEDICATIONS DAY OF YOUR SURGERY                               You may not have any metal on your body including hair pins and              piercings  Do not wear jewelry,  lotions, powders or perfumes, deodorant              Men may shave face and neck.   Do not bring valuables to the hospital. [facility] IS NOT             RESPONSIBLE   FOR VALUABLES.  Contacts, dentures or bridgework may not be worn into surgery.    Special Instructions: N/A              Please read over the following fact sheets you were given: _____________________________________________________________________             [facility] - Preparing for Surgery Before surgery,  you can play an important role.  Because skin is not sterile, your skin needs to be as free of germs as possible.  You can reduce the number of germs on your skin by washing with CHG (chlorahexidine gluconate) soap before surgery.  CHG is an antiseptic cleaner which kills germs and bonds with the skin to continue killing germs even after washing. Please DO NOT use if you have an allergy to CHG or antibacterial soaps.  If your skin becomes reddened/irritated stop using the CHG and inform your nurse when you arrive at Short Stay. Do not shave (including legs and underarms) for at least 48 hours prior to the first CHG shower.  You may shave your face/neck. Please follow these instructions carefully:  1.  Shower with CHG Soap the night before surgery and the  morning of Surgery.  2.  If you choose to wash your hair, wash your hair first as usual with your  normal  shampoo.  3.  After you shampoo, rinse your hair and body thoroughly to remove the  shampoo.                           4.  Use CHG as you would any other liquid soap.  You can apply chg directly  to the skin and wash                       Gently with a scrungie or clean washcloth.  5.  Apply the CHG Soap to your body ONLY FROM THE NECK DOWN.   Do not use on face/ open                           Wound or open sores. Avoid contact with eyes, ears mouth and genitals (private parts).                       Wash face,  Genitals (private parts) with your normal soap.             6.  Wash thoroughly, paying special attention to the area where your surgery  will be performed.  7.  Thoroughly rinse your body with warm water from the neck down.  8.  DO NOT shower/wash with your normal soap after using and rinsing off  the CHG Soap.                9.  Pat yourself dry with a clean towel.            10.  Wear clean pajamas.            11.  Place clean sheets on your bed the night of your first shower and do not  sleep with pets. Day of Surgery : Do not  apply any lotions/deodorants the morning of surgery.  Please wear clean clothes to the hospital/surgery center.  FAILURE TO FOLLOW THESE INSTRUCTIONS MAY RESULT IN THE CANCELLATION OF YOUR SURGERY PATIENT SIGNATURE_________________________________  NURSE SIGNATURE__________________________________  ________________________________________________________________________   Martin Holt  An incentive spirometer is a tool that can help keep your lungs clear and active. This tool measures how well you are filling your lungs with each breath. Taking long deep breaths may help reverse or decrease the chance of developing breathing (pulmonary) problems (especially infection) following:  A long period of time when you are unable to move or be active. BEFORE THE PROCEDURE   If the spirometer includes an indicator to show your best effort, your nurse or respiratory therapist will set it to a desired goal.  If possible, sit up straight or lean slightly forward. Try not to slouch.  Hold the incentive spirometer in an upright position. INSTRUCTIONS FOR USE  1. Sit on the edge of your bed if possible, or sit up as far as you can in bed or on a chair. 2. Hold the incentive spirometer in an upright position. 3. Breathe out normally. 4. Place the mouthpiece in your mouth and seal your lips tightly around it. 5. Breathe in slowly and as deeply as possible, raising the piston or the ball toward the top of the column. 6. Hold your breath for 3-5 seconds or for as long as possible. Allow the piston or ball to fall to the bottom of the column. 7. Remove the mouthpiece from your mouth and breathe  out normally. 8. Rest for a few seconds and repeat Steps 1 through 7 at least 10 times every 1-2 hours when you are awake. Take your time and take a few normal breaths between deep breaths. 9. The spirometer may include an indicator to show your best effort. Use the indicator as a goal to work toward during  each repetition. 10. After each set of 10 deep breaths, practice coughing to be sure your lungs are clear. If you have an incision (the cut made at the time of surgery), support your incision when coughing by placing a pillow or rolled up towels firmly against it. Once you are able to get out of bed, walk around indoors and cough well. You may stop using the incentive spirometer when instructed by your caregiver.  RISKS AND COMPLICATIONS  Take your time so you do not get dizzy or light-headed.  If you are in pain, you may need to take or ask for pain medication before doing incentive spirometry. It is harder to take a deep breath if you are having pain. AFTER USE  Rest and breathe slowly and easily.  It can be helpful to keep track of a log of your progress. Your caregiver can provide you with a simple table to help with this. If you are using the spirometer at home, follow these instructions: SEEK MEDICAL CARE IF:   You are having difficultly using the spirometer.  You have trouble using the spirometer as often as instructed.  Your pain medication is not giving enough relief while using the spirometer.  You develop fever of 100.5 F (38.1 C) or higher. SEEK IMMEDIATE MEDICAL CARE IF:   You cough up bloody sputum that had not been present before.  You develop fever of 102 F (38.9 C) or greater.  You develop worsening pain at or near the incision site. MAKE SURE YOU:   Understand these instructions.  Will watch your condition.  Will get help right away if you are not doing well or get worse. Document Released: 05/20/2006 Document Revised: 04/01/2011 Document Reviewed: 07/21/2006 Exit[employer] Patient Information 2014 [employer], [place].   ________________________________________________________________________  WHAT IS A BLOOD TRANSFUSION? Blood Transfusion Information  A transfusion is the replacement of blood or some of its parts. Blood is made up of multiple cells which  provide different functions.  Red blood cells carry oxygen and are used for blood loss replacement.  White blood cells fight against infection.  Platelets control bleeding.  Plasma helps clot blood.  Other blood products are available for specialized needs, such as hemophilia or other clotting disorders. BEFORE THE TRANSFUSION  Who gives blood for transfusions?   Healthy volunteers who are fully evaluated to make sure their blood is safe. This is blood bank blood. Transfusion therapy is the safest it has ever been in the practice of medicine. Before blood is taken from a donor, a complete history is taken to make sure that person has no history of diseases nor engages in risky social behavior (examples are intravenous drug use or sexual activity with multiple partners). The donor's travel history is screened to minimize risk of transmitting infections, such as malaria. The donated blood is tested for signs of infectious diseases, such as HIV and hepatitis. The blood is then tested to be sure it is compatible with you in order to minimize the chance of a transfusion reaction. If you or a relative donates blood, this is often done in anticipation of surgery and is not appropriate for emergency  situations. It takes many days to process the donated blood. RISKS AND COMPLICATIONS Although transfusion therapy is very safe and saves many lives, the main dangers of transfusion include:   Getting an infectious disease.  Developing a transfusion reaction. This is an allergic reaction to something in the blood you were given. Every precaution is taken to prevent this. The decision to have a blood transfusion has been considered carefully by your caregiver before blood is given. Blood is not given unless the benefits outweigh the risks. AFTER THE TRANSFUSION  Right after receiving a blood transfusion, you will usually feel much better and more energetic. This is especially true if your red blood cells  have gotten low (anemic). The transfusion raises the level of the red blood cells which carry oxygen, and this usually causes an energy increase.  The nurse administering the transfusion will monitor you carefully for complications. HOME CARE INSTRUCTIONS  No special instructions are needed after a transfusion. You may find your energy is better. Speak with your caregiver about any limitations on activity for underlying diseases you may have. SEEK MEDICAL CARE IF:   Your condition is not improving after your transfusion.  You develop redness or irritation at the intravenous (IV) site. SEEK IMMEDIATE MEDICAL CARE IF:  Any of the following symptoms occur over the next 12 hours:  Shaking chills.  You have a temperature by mouth above 102 F (38.9 C), not controlled by medicine.  Chest, back, or muscle pain.  People around you feel you are not acting correctly or are confused.  Shortness of breath or difficulty breathing.  Dizziness and fainting.  You get a rash or develop hives.  You have a decrease in urine output.  Your urine turns a dark color or changes to pink, red, or brown. Any of the following symptoms occur over the next 10 days:  You have a temperature by mouth above 102 F (38.9 C), not controlled by medicine.  Shortness of breath.  Weakness after normal activity.  The white part of the eye turns yellow (jaundice).  You have a decrease in the amount of urine or are urinating less often.  Your urine turns a dark color or changes to pink, red, or brown. Document Released: 01/05/2000 Document Revised: 04/01/2011 Document Reviewed: 08/24/2007 [facility] Patient Information 2014 [place], [place].  _______________________________________________________________________

## 2018-10-28 ENCOUNTER — Other Ambulatory Visit: Payer: Self-pay

## 2018-10-28 ENCOUNTER — Encounter (HOSPITAL_COMMUNITY): Payer: Self-pay

## 2018-10-28 ENCOUNTER — Encounter (HOSPITAL_COMMUNITY)
Admission: RE | Admit: 2018-10-28 | Discharge: 2018-10-28 | Disposition: A | Discharge status: Home / Self Care | Payer: Medicare HMO | Source: Ambulatory Visit | Attending: Orthopedic Surgery | Admitting: Orthopedic Surgery

## 2018-10-28 DIAGNOSIS — Z01812 Encounter for preprocedural laboratory examination: Secondary | ICD-10-CM | POA: Insufficient documentation

## 2018-10-28 DIAGNOSIS — E119 Type 2 diabetes mellitus without complications: Secondary | ICD-10-CM | POA: Insufficient documentation

## 2018-10-28 HISTORY — DX: Type 2 diabetes mellitus without complications: E11.9

## 2018-10-28 HISTORY — DX: Family history of other specified conditions: Z84.89

## 2018-10-28 HISTORY — DX: Myoneural disorder, unspecified: G70.9

## 2018-10-28 LAB — CBC
HCT: 48.9 % (ref 39.0–52.0)
Hemoglobin: 16.3 g/dL (ref 13.0–17.0)
MCH: 28.8 pg (ref 26.0–34.0)
MCHC: 33.3 g/dL (ref 30.0–36.0)
MCV: 86.5 fL (ref 80.0–100.0)
Platelets: 308 10*3/uL (ref 150–400)
RBC: 5.65 MIL/uL (ref 4.22–5.81)
RDW: 14.9 % (ref 11.5–15.5)
WBC: 9.6 10*3/uL (ref 4.0–10.5)
nRBC: 0 % (ref 0.0–0.2)

## 2018-10-28 LAB — BASIC METABOLIC PANEL WITH GFR
Anion gap: 10 (ref 5–15)
BUN: 17 mg/dL (ref 8–23)
CO2: 25 mmol/L (ref 22–32)
Calcium: 9.5 mg/dL (ref 8.9–10.3)
Chloride: 104 mmol/L (ref 98–111)
Creatinine, Ser: 0.72 mg/dL (ref 0.61–1.24)
GFR calc Af Amer: >60 mL/min
GFR calc non Af Amer: >60 mL/min
Glucose, Bld: 148 mg/dL — ABNORMAL HIGH (ref 70–99)
Potassium: 3.7 mmol/L (ref 3.5–5.1)
Sodium: 139 mmol/L (ref 135–145)

## 2018-10-28 LAB — SURGICAL PCR SCREEN
MRSA, PCR: NEGATIVE
Staphylococcus aureus: NEGATIVE

## 2018-10-28 LAB — GLUCOSE, CAPILLARY: Glucose-Capillary: 178 mg/dL — ABNORMAL HIGH (ref 70–99)

## 2018-10-28 NOTE — Progress Notes (Signed)
PCP - Evonnie Pat Cardiologist -   Chest x-ray -  EKG - 10-14-18 on chart Stress Test -  ECHO - 06-18-17 chart Cardiac Cath -  Hgba1c, cbc,cmp 10-14-18 on chart  Sleep Study -  CPAP - yes  Fasting Blood Sugar - <110  No DM meds Checks Blood Sugar __2-3 ___ times a week Blood Thinner Instructions: Aspirin Instructions: Last Dose:  Anesthesia review: cpap  Patient denies shortness of breath, fever, cough and chest pain at PAT appointment   Patient verbalized understanding of instructions that were given to them at the PAT appointment. Patient was also instructed that they will need to review over the PAT instructions again at home before surgery.

## 2018-10-29 LAB — ABO/RH: ABO/RH(D): O POS

## 2018-10-30 ENCOUNTER — Other Ambulatory Visit (HOSPITAL_COMMUNITY)
Admission: RE | Admit: 2018-10-30 | Discharge: 2018-10-30 | Disposition: A | Discharge status: Home / Self Care | Payer: Medicare HMO | Source: Ambulatory Visit | Attending: Orthopedic Surgery | Admitting: Orthopedic Surgery

## 2018-10-30 DIAGNOSIS — Z01812 Encounter for preprocedural laboratory examination: Secondary | ICD-10-CM | POA: Insufficient documentation

## 2018-10-30 DIAGNOSIS — Z20828 Contact with and (suspected) exposure to other viral communicable diseases: Secondary | ICD-10-CM | POA: Insufficient documentation

## 2018-10-30 NOTE — H&P (Signed)
TOTAL HIP ADMISSION H&P  Patient is admitted for right total hip arthroplasty, anterior approach.  Subjective:  Chief Complaint:   Right hip primary OA / pain  HPI: Martin Holt, 63 y.o. male, has a history of pain and functional disability in the right hip(s) due to arthritis and patient has failed non-surgical conservative treatments for greater than 12 weeks to include NSAID's and/or analgesics, use of assistive devices and activity modification.  Onset of symptoms was gradual starting 2+ years ago with gradually worsening course since that time.The patient noted prior procedures of the hip to include SCFE on the right hip(s).  Patient currently rates pain in the right hip at 9 out of 10 with activity. Patient has night pain, worsening of pain with activity and weight bearing, trendelenberg gait, pain that interfers with activities of daily living and pain with passive range of motion. Patient has evidence of periarticular osteophytes and joint space narrowing by imaging studies. This condition presents safety issues increasing the risk of falls.   There is no current active infection.   Risks, benefits and expectations were discussed with the patient.  Risks including but not limited to the risk of anesthesia, blood clots, nerve damage, blood vessel damage, failure of the prosthesis, infection and up to and including death.  Patient understand the risks, benefits and expectations and wishes to proceed with surgery.   PCP: Kathreen Devoid, PA-C  D/C Plans:       Home   Post-op Meds:       No Rx given   Tranexamic Acid:      To be given - IV   Decadron:      Is to be given  FYI:     ASA  Oxycodone (on prior to surgery)  CPAP  DME:   Pt already has equipment   PT:  HEP  Pharmacy: [facility]    Patient Active Problem List   Diagnosis Date Noted  . Cervical vertebral fusion 03/27/2016   Past Medical History:  Diagnosis Date  . Arthritis   . Diabetes mellitus  without complication ([place])    type 2 no meds  . Family history of adverse reaction to anesthesia   . GERD (gastroesophageal reflux disease)   . Hypertension   . Neuromuscular disorder ([place])    foot drop, loss feeling left foot  can not wiggle toes or lift foot  . PONV (postoperative nausea and vomiting)   . Sleep apnea    cpap    Past Surgical History:  Procedure Laterality Date  . ANTERIOR CERVICAL DECOMP/DISCECTOMY FUSION N/A 03/27/2016   Procedure: CERVICAL FOUR-FIVE, CERVICAL SIX, SEVEN ANTERIOR CERVICAL DECOMPRESSION/DISCECTOMY FUSION;  Surgeon: Eustace Moore, MD;  Location: [facility];  Service: Neurosurgery;  Laterality: N/A;  . [place], 2002  . carpel tunnel  2005   right and left  . CHOLECYSTECTOMY  2003  . HIP SURGERY     right side pins in and removed  . NECK SURGERY  2004  . pionidal cyst     removed x 2  . right hand surgery     injury   . shouler surgery      2019 June  right rotator cuff repair  . WRIST SURGERY Right 2004    No current facility-administered medications for this encounter.    Current Outpatient Medications  Medication Sig Dispense Refill Last Dose  . amLODipine (NORVASC) 10 MG tablet Take 10 mg by mouth daily.      Marland Kitchen  Ascorbic Acid (VITAMIN C) 1000 MG tablet Take 1,000 mg by mouth daily.     Marland Kitchen buPROPion (WELLBUTRIN XL) 150 MG 24 hr tablet Take 150 mg by mouth daily.     . cholecalciferol (VITAMIN D3) 25 MCG (1000 UT) tablet Take 4,000 Units by mouth daily.     Marland Kitchen gabapentin (NEURONTIN) 800 MG tablet Take 800 mg by mouth 4 (four) times daily.     . hydrochlorothiazide (HYDRODIURIL) 25 MG tablet Take 25 mg by mouth daily.     Marland Kitchen HYDROcodone-acetaminophen (NORCO) 7.5-325 MG tablet Take 1 tablet by mouth every 4 (four) hours as needed for moderate pain. (Patient taking differently: Take 1 tablet by mouth 3 (three) times daily. ) 30 tablet 0   . meloxicam (MOBIC) 15 MG tablet Take 15 mg by mouth daily.     Marland Kitchen omeprazole (PRILOSEC) 20 MG  capsule Take 20 mg by mouth daily.      . quinapril (ACCUPRIL) 40 MG tablet Take 40 mg by mouth daily.     Marland Kitchen testosterone cypionate (DEPOTESTOSTERONE CYPIONATE) 200 MG/ML injection Inject 100 mg into the muscle every 14 (fourteen) days.     Marland Kitchen tiZANidine (ZANAFLEX) 4 MG tablet Take 4 mg by mouth 2 (two) times daily.     . cyclobenzaprine (FLEXERIL) 10 MG tablet Take 1 tablet (10 mg total) by mouth 3 (three) times daily as needed for muscle spasms. May take an additional 10mg s as needed for muscle spasms (Patient not taking: Reported on 10/26/2018) 30 tablet 0 Not Taking at Unknown time   Allergies  Allergen Reactions  . Codeine Nausea Only    Social History   Tobacco Use  . Smoking status: Former Smoker    Quit date: 04/05/1994    Years since quitting: 24.5  . Smokeless tobacco: Never Used  Substance Use Topics  . Alcohol use: No       Review of Systems  Constitutional: Negative.   HENT: Negative.   Eyes: Negative.   Respiratory: Negative.   Cardiovascular: Negative.   Gastrointestinal: Positive for heartburn.  Genitourinary: Negative.   Musculoskeletal: Positive for back pain and joint pain.  Skin: Negative.   Neurological: Negative.   Endo/Heme/Allergies: Negative.   Psychiatric/Behavioral: Negative.     Objective:  Physical Exam  Constitutional: He is oriented to person, place, and time. He appears well-developed.  HENT:  Head: Normocephalic.  Eyes: Pupils are equal, round, and reactive to light.  Neck: Neck supple. No JVD present. No tracheal deviation present. No thyromegaly present.  Cardiovascular: Normal rate, regular rhythm and intact distal pulses.  Respiratory: Effort normal and breath sounds normal. No respiratory distress. He has no wheezes.  GI: Soft. There is no abdominal tenderness. There is no guarding.  Musculoskeletal:     Right hip: He exhibits decreased range of motion, decreased strength, tenderness and bony tenderness. He exhibits no swelling, no  deformity and no laceration.  Lymphadenopathy:    He has no cervical adenopathy.  Neurological: He is alert and oriented to person, place, and time. A sensory deficit (right LE neuropathy; Left neuropathy and dropfoot (back)) is present.  Skin: Skin is warm and dry.  Psychiatric: He has a normal mood and affect.      Labs:  Estimated body mass index is 35.49 kg/m as calculated from the following:   Height as of 10/28/18: 6\' 1"  (1.854 m).   Weight as of 10/28/18: 122 kg.   Imaging Review Plain radiographs demonstrate severe degenerative joint disease of the  right hip(s). The bone quality appears to be good for age and reported activity level.      Assessment/Plan:  End stage arthritis, right hip  The patient history, physical examination, clinical judgement of the provider and imaging studies are consistent with end stage degenerative joint disease of the right hip(s) and total hip arthroplasty is deemed medically necessary. The treatment options including medical management, injection therapy, arthroscopy and arthroplasty were discussed at length. The risks and benefits of total hip arthroplasty were presented and reviewed. The risks due to aseptic loosening, infection, stiffness, dislocation/subluxation,  thromboembolic complications and other imponderables were discussed.  The patient acknowledged the explanation, agreed to proceed with the plan and consent was signed. Patient is being admitted for inpatient treatment for surgery, pain control, PT, OT, prophylactic antibiotics, VTE prophylaxis, progressive ambulation and ADL's and discharge planning.The patient is planning to be discharged home.   Anastasio AuerbachMatthew S. Emika Tiano   PA-C  10/30/2018, 3:43 PM

## 2018-11-02 LAB — NOVEL CORONAVIRUS, NAA (HOSP ORDER, SEND-OUT TO REF LAB; TAT 18-24 HRS): SARS-CoV-2, NAA: NOT DETECTED

## 2018-11-02 MED ORDER — DEXTROSE 5 % IV SOLN
3.0000 g | INTRAVENOUS | Status: AC
  Administered 2018-11-03: 3 g via INTRAVENOUS
  Filled 2018-11-02: qty 3

## 2018-11-03 ENCOUNTER — Observation Stay (HOSPITAL_COMMUNITY): Payer: Medicare HMO

## 2018-11-03 ENCOUNTER — Other Ambulatory Visit: Payer: Self-pay

## 2018-11-03 ENCOUNTER — Ambulatory Visit (HOSPITAL_COMMUNITY): Payer: Medicare HMO | Admitting: Certified Registered Nurse Anesthetist

## 2018-11-03 ENCOUNTER — Inpatient Hospital Stay (HOSPITAL_COMMUNITY)
Admission: RE | Admit: 2018-11-03 | Discharge: 2018-11-05 | DRG: 470 | Disposition: A | Discharge status: Home / Self Care | Payer: Medicare HMO | Attending: Orthopedic Surgery | Admitting: Orthopedic Surgery

## 2018-11-03 ENCOUNTER — Ambulatory Visit (HOSPITAL_COMMUNITY): Payer: Medicare HMO

## 2018-11-03 ENCOUNTER — Ambulatory Visit (HOSPITAL_COMMUNITY): Payer: Medicare HMO | Admitting: Physician Assistant

## 2018-11-03 ENCOUNTER — Encounter (HOSPITAL_COMMUNITY)
Admission: RE | Disposition: A | Discharge status: Home / Self Care | Payer: Self-pay | Source: Home / Self Care | Attending: Orthopedic Surgery

## 2018-11-03 ENCOUNTER — Encounter (HOSPITAL_COMMUNITY): Payer: Self-pay

## 2018-11-03 DIAGNOSIS — Z96649 Presence of unspecified artificial hip joint: Secondary | ICD-10-CM

## 2018-11-03 DIAGNOSIS — Z6835 Body mass index (BMI) 35.0-35.9, adult: Secondary | ICD-10-CM

## 2018-11-03 DIAGNOSIS — Z87891 Personal history of nicotine dependence: Secondary | ICD-10-CM

## 2018-11-03 DIAGNOSIS — Z981 Arthrodesis status: Secondary | ICD-10-CM

## 2018-11-03 DIAGNOSIS — G4730 Sleep apnea, unspecified: Secondary | ICD-10-CM | POA: Diagnosis present

## 2018-11-03 DIAGNOSIS — M21372 Foot drop, left foot: Secondary | ICD-10-CM | POA: Diagnosis present

## 2018-11-03 DIAGNOSIS — Z96641 Presence of right artificial hip joint: Secondary | ICD-10-CM

## 2018-11-03 DIAGNOSIS — Z79899 Other long term (current) drug therapy: Secondary | ICD-10-CM

## 2018-11-03 DIAGNOSIS — Z419 Encounter for procedure for purposes other than remedying health state, unspecified: Secondary | ICD-10-CM

## 2018-11-03 DIAGNOSIS — Z791 Long term (current) use of non-steroidal anti-inflammatories (NSAID): Secondary | ICD-10-CM

## 2018-11-03 DIAGNOSIS — I10 Essential (primary) hypertension: Secondary | ICD-10-CM | POA: Diagnosis present

## 2018-11-03 DIAGNOSIS — E119 Type 2 diabetes mellitus without complications: Secondary | ICD-10-CM | POA: Diagnosis present

## 2018-11-03 DIAGNOSIS — E669 Obesity, unspecified: Secondary | ICD-10-CM | POA: Diagnosis present

## 2018-11-03 DIAGNOSIS — Z885 Allergy status to narcotic agent status: Secondary | ICD-10-CM

## 2018-11-03 DIAGNOSIS — Z79890 Hormone replacement therapy (postmenopausal): Secondary | ICD-10-CM

## 2018-11-03 DIAGNOSIS — G709 Myoneural disorder, unspecified: Secondary | ICD-10-CM | POA: Diagnosis present

## 2018-11-03 DIAGNOSIS — K219 Gastro-esophageal reflux disease without esophagitis: Secondary | ICD-10-CM | POA: Diagnosis present

## 2018-11-03 DIAGNOSIS — M1611 Unilateral primary osteoarthritis, right hip: Principal | ICD-10-CM | POA: Diagnosis present

## 2018-11-03 HISTORY — PX: TOTAL HIP ARTHROPLASTY: SHX124

## 2018-11-03 LAB — TYPE AND SCREEN
ABO/RH(D): O POS
Antibody Screen: NEGATIVE

## 2018-11-03 LAB — GLUCOSE, CAPILLARY
Glucose-Capillary: 123 mg/dL — ABNORMAL HIGH (ref 70–99)
Glucose-Capillary: 112 mg/dL — ABNORMAL HIGH (ref 70–99)
Glucose-Capillary: 154 mg/dL — ABNORMAL HIGH (ref 70–99)

## 2018-11-03 SURGERY — ARTHROPLASTY, HIP, TOTAL, ANTERIOR APPROACH
Anesthesia: General | Site: Hip | Laterality: Right

## 2018-11-03 MED ORDER — SODIUM CHLORIDE 0.9 % IR SOLN
Status: DC | PRN
  Administered 2018-11-03: 1000 mL

## 2018-11-03 MED ORDER — OXYCODONE HCL 5 MG PO TABS: 5.0000 mg | ORAL_TABLET | Freq: Once | ORAL | Status: DC | PRN

## 2018-11-03 MED ORDER — EPHEDRINE SULFATE-NACL 50-0.9 MG/10ML-% IV SOSY
PREFILLED_SYRINGE | INTRAVENOUS | Status: DC | PRN
  Administered 2018-11-03 (×2): 10 mg via INTRAVENOUS

## 2018-11-03 MED ORDER — DOCUSATE SODIUM 100 MG PO CAPS
100.0000 mg | ORAL_CAPSULE | Freq: Two times a day (BID) | ORAL | Status: DC
  Administered 2018-11-03 – 2018-11-05 (×4): 100 mg via ORAL
  Filled 2018-11-03 (×4): qty 1

## 2018-11-03 MED ORDER — PANTOPRAZOLE SODIUM 40 MG PO TBEC
40.0000 mg | DELAYED_RELEASE_TABLET | Freq: Every day | ORAL | Status: DC
  Administered 2018-11-04 – 2018-11-05 (×2): 40 mg via ORAL
  Filled 2018-11-03 (×2): qty 1

## 2018-11-03 MED ORDER — MIDAZOLAM HCL 2 MG/2ML IJ SOLN
INTRAMUSCULAR | Status: AC
  Filled 2018-11-03: qty 2

## 2018-11-03 MED ORDER — HYDROCHLOROTHIAZIDE 25 MG PO TABS
25.0000 mg | ORAL_TABLET | Freq: Every day | ORAL | Status: DC
  Administered 2018-11-04 – 2018-11-05 (×2): 25 mg via ORAL
  Filled 2018-11-03 (×2): qty 1

## 2018-11-03 MED ORDER — PHENYLEPHRINE HCL (PRESSORS) 10 MG/ML IV SOLN
INTRAVENOUS | Status: AC
  Filled 2018-11-03: qty 1

## 2018-11-03 MED ORDER — HYDROCODONE-ACETAMINOPHEN 5-325 MG PO TABS
1.0000 | ORAL_TABLET | ORAL | Status: DC | PRN
  Administered 2018-11-03: 2 via ORAL
  Filled 2018-11-03: qty 2

## 2018-11-03 MED ORDER — CHLORHEXIDINE GLUCONATE 4 % EX LIQD: 60.0000 mL | Freq: Once | CUTANEOUS | Status: DC

## 2018-11-03 MED ORDER — PROPOFOL 10 MG/ML IV BOLUS
INTRAVENOUS | Status: DC | PRN
  Administered 2018-11-03: 20 mg via INTRAVENOUS

## 2018-11-03 MED ORDER — SODIUM CHLORIDE 0.9 % IV SOLN
INTRAVENOUS | Status: DC
  Administered 2018-11-03 – 2018-11-04 (×2): via INTRAVENOUS

## 2018-11-03 MED ORDER — CEFAZOLIN SODIUM-DEXTROSE 2-4 GM/100ML-% IV SOLN
INTRAVENOUS | Status: AC
  Filled 2018-11-03: qty 200

## 2018-11-03 MED ORDER — STERILE WATER FOR IRRIGATION IR SOLN
Status: DC | PRN
  Administered 2018-11-03 (×2): 1000 mL

## 2018-11-03 MED ORDER — MAGNESIUM CITRATE PO SOLN: 1.0000 | Freq: Once | ORAL | Status: DC | PRN

## 2018-11-03 MED ORDER — TIZANIDINE HCL 4 MG PO TABS
4.0000 mg | ORAL_TABLET | Freq: Two times a day (BID) | ORAL | Status: DC
  Administered 2018-11-03 – 2018-11-05 (×5): 4 mg via ORAL
  Filled 2018-11-03 (×5): qty 1

## 2018-11-03 MED ORDER — FENTANYL CITRATE (PF) 100 MCG/2ML IJ SOLN
25.0000 ug | INTRAMUSCULAR | Status: DC | PRN
  Administered 2018-11-03 (×2): 50 ug via INTRAVENOUS

## 2018-11-03 MED ORDER — TRANEXAMIC ACID-NACL 1000-0.7 MG/100ML-% IV SOLN
1000.0000 mg | Freq: Once | INTRAVENOUS | Status: AC
  Administered 2018-11-03: 1000 mg via INTRAVENOUS
  Filled 2018-11-03: qty 100

## 2018-11-03 MED ORDER — PHENYLEPHRINE 40 MCG/ML (10ML) SYRINGE FOR IV PUSH (FOR BLOOD PRESSURE SUPPORT)
PREFILLED_SYRINGE | INTRAVENOUS | Status: AC
  Filled 2018-11-03: qty 10

## 2018-11-03 MED ORDER — HYDROCODONE-ACETAMINOPHEN 7.5-325 MG PO TABS
1.0000 | ORAL_TABLET | ORAL | Status: DC | PRN
  Administered 2018-11-03 (×2): 1 via ORAL
  Administered 2018-11-03 – 2018-11-05 (×6): 2 via ORAL
  Filled 2018-11-03 (×6): qty 2

## 2018-11-03 MED ORDER — MORPHINE SULFATE (PF) 4 MG/ML IV SOLN: 0.5000 mg | INTRAVENOUS | Status: DC | PRN

## 2018-11-03 MED ORDER — METOCLOPRAMIDE HCL 5 MG/ML IJ SOLN: 5.0000 mg | Freq: Three times a day (TID) | INTRAMUSCULAR | Status: DC | PRN

## 2018-11-03 MED ORDER — BUPROPION HCL ER (XL) 150 MG PO TB24
150.0000 mg | ORAL_TABLET | Freq: Every day | ORAL | Status: DC
  Administered 2018-11-04 – 2018-11-05 (×2): 150 mg via ORAL
  Filled 2018-11-03 (×2): qty 1

## 2018-11-03 MED ORDER — ONDANSETRON HCL 4 MG/2ML IJ SOLN
INTRAMUSCULAR | Status: AC
  Filled 2018-11-03: qty 2

## 2018-11-03 MED ORDER — CELECOXIB 200 MG PO CAPS
200.0000 mg | ORAL_CAPSULE | Freq: Two times a day (BID) | ORAL | Status: DC | PRN
  Administered 2018-11-03 – 2018-11-05 (×2): 200 mg via ORAL
  Filled 2018-11-03 (×3): qty 1

## 2018-11-03 MED ORDER — BUPIVACAINE IN DEXTROSE 0.75-8.25 % IT SOLN
INTRATHECAL | Status: DC | PRN
  Administered 2018-11-03: 2 mL via INTRATHECAL

## 2018-11-03 MED ORDER — DEXAMETHASONE SODIUM PHOSPHATE 10 MG/ML IJ SOLN
INTRAMUSCULAR | Status: AC
  Filled 2018-11-03: qty 1

## 2018-11-03 MED ORDER — METOCLOPRAMIDE HCL 5 MG PO TABS
5.0000 mg | ORAL_TABLET | Freq: Three times a day (TID) | ORAL | Status: DC | PRN
  Filled 2018-11-03: qty 2

## 2018-11-03 MED ORDER — ACETAMINOPHEN 325 MG PO TABS: 325.0000 mg | ORAL_TABLET | Freq: Four times a day (QID) | ORAL | Status: DC | PRN

## 2018-11-03 MED ORDER — DIPHENHYDRAMINE HCL 12.5 MG/5ML PO ELIX: 12.5000 mg | ORAL_SOLUTION | ORAL | Status: DC | PRN

## 2018-11-03 MED ORDER — ONDANSETRON HCL 4 MG/2ML IJ SOLN
INTRAMUSCULAR | Status: DC | PRN
  Administered 2018-11-03: 4 mg via INTRAVENOUS

## 2018-11-03 MED ORDER — ONDANSETRON HCL 4 MG PO TABS
4.0000 mg | ORAL_TABLET | Freq: Four times a day (QID) | ORAL | Status: DC | PRN
  Filled 2018-11-03: qty 1

## 2018-11-03 MED ORDER — DEXAMETHASONE SODIUM PHOSPHATE 10 MG/ML IJ SOLN
10.0000 mg | Freq: Once | INTRAMUSCULAR | Status: AC
  Administered 2018-11-04: 10 mg via INTRAVENOUS
  Filled 2018-11-03: qty 1

## 2018-11-03 MED ORDER — DEXAMETHASONE SODIUM PHOSPHATE 10 MG/ML IJ SOLN
10.0000 mg | Freq: Once | INTRAMUSCULAR | Status: AC
  Administered 2018-11-03: 5 mg via INTRAVENOUS

## 2018-11-03 MED ORDER — ACETAMINOPHEN 160 MG/5ML PO SOLN: 1000.0000 mg | Freq: Once | ORAL | Status: DC | PRN

## 2018-11-03 MED ORDER — BISACODYL 10 MG RE SUPP: 10.0000 mg | Freq: Every day | RECTAL | Status: DC | PRN

## 2018-11-03 MED ORDER — HYDROCODONE-ACETAMINOPHEN 7.5-325 MG PO TABS
ORAL_TABLET | ORAL | Status: AC
  Filled 2018-11-03: qty 1

## 2018-11-03 MED ORDER — LACTATED RINGERS IV SOLN
INTRAVENOUS | Status: DC
  Administered 2018-11-03 (×2): via INTRAVENOUS

## 2018-11-03 MED ORDER — FENTANYL CITRATE (PF) 100 MCG/2ML IJ SOLN
INTRAMUSCULAR | Status: DC | PRN
  Administered 2018-11-03 (×2): 50 ug via INTRAVENOUS

## 2018-11-03 MED ORDER — GABAPENTIN 400 MG PO CAPS
800.0000 mg | ORAL_CAPSULE | Freq: Four times a day (QID) | ORAL | Status: DC
  Administered 2018-11-03 – 2018-11-05 (×9): 800 mg via ORAL
  Filled 2018-11-03 (×10): qty 2

## 2018-11-03 MED ORDER — FERROUS SULFATE 325 (65 FE) MG PO TABS
325.0000 mg | ORAL_TABLET | Freq: Three times a day (TID) | ORAL | Status: DC
  Administered 2018-11-03 – 2018-11-05 (×7): 325 mg via ORAL
  Filled 2018-11-03 (×7): qty 1

## 2018-11-03 MED ORDER — ASPIRIN 81 MG PO CHEW
81.0000 mg | CHEWABLE_TABLET | Freq: Two times a day (BID) | ORAL | Status: DC
  Administered 2018-11-03 – 2018-11-05 (×4): 81 mg via ORAL
  Filled 2018-11-03 (×4): qty 1

## 2018-11-03 MED ORDER — FENTANYL CITRATE (PF) 100 MCG/2ML IJ SOLN
INTRAMUSCULAR | Status: AC
  Filled 2018-11-03: qty 2

## 2018-11-03 MED ORDER — PROPOFOL 500 MG/50ML IV EMUL
INTRAVENOUS | Status: AC
  Filled 2018-11-03: qty 100

## 2018-11-03 MED ORDER — PHENOL 1.4 % MT LIQD: 1.0000 | OROMUCOSAL | Status: DC | PRN

## 2018-11-03 MED ORDER — OXYCODONE HCL 5 MG/5ML PO SOLN: 5.0000 mg | Freq: Once | ORAL | Status: DC | PRN

## 2018-11-03 MED ORDER — MENTHOL 3 MG MT LOZG: 1.0000 | LOZENGE | OROMUCOSAL | Status: DC | PRN

## 2018-11-03 MED ORDER — CEFAZOLIN SODIUM-DEXTROSE 2-4 GM/100ML-% IV SOLN
INTRAVENOUS | Status: AC
  Filled 2018-11-03: qty 100

## 2018-11-03 MED ORDER — CEFAZOLIN SODIUM-DEXTROSE 2-4 GM/100ML-% IV SOLN
2.0000 g | Freq: Four times a day (QID) | INTRAVENOUS | Status: AC
  Administered 2018-11-03 – 2018-11-04 (×2): 2 g via INTRAVENOUS
  Filled 2018-11-03 (×2): qty 100

## 2018-11-03 MED ORDER — POLYETHYLENE GLYCOL 3350 17 G PO PACK
17.0000 g | PACK | Freq: Two times a day (BID) | ORAL | Status: DC
  Administered 2018-11-04 – 2018-11-05 (×3): 17 g via ORAL
  Filled 2018-11-03 (×4): qty 1

## 2018-11-03 MED ORDER — ONDANSETRON HCL 4 MG/2ML IJ SOLN
4.0000 mg | Freq: Four times a day (QID) | INTRAMUSCULAR | Status: DC | PRN
  Administered 2018-11-03: 4 mg via INTRAVENOUS
  Filled 2018-11-03: qty 2

## 2018-11-03 MED ORDER — PROPOFOL 500 MG/50ML IV EMUL
INTRAVENOUS | Status: DC | PRN
  Administered 2018-11-03: 75 ug/kg/min via INTRAVENOUS
  Administered 2018-11-03: 50 ug/kg/min via INTRAVENOUS

## 2018-11-03 MED ORDER — TRANEXAMIC ACID-NACL 1000-0.7 MG/100ML-% IV SOLN
1000.0000 mg | INTRAVENOUS | Status: AC
  Administered 2018-11-03: 1000 mg via INTRAVENOUS
  Filled 2018-11-03: qty 100

## 2018-11-03 MED ORDER — ALUM & MAG HYDROXIDE-SIMETH 200-200-20 MG/5ML PO SUSP: 15.0000 mL | ORAL | Status: DC | PRN

## 2018-11-03 MED ORDER — PHENYLEPHRINE 40 MCG/ML (10ML) SYRINGE FOR IV PUSH (FOR BLOOD PRESSURE SUPPORT)
PREFILLED_SYRINGE | INTRAVENOUS | Status: DC | PRN
  Administered 2018-11-03 (×6): 80 ug via INTRAVENOUS

## 2018-11-03 MED ORDER — CHLORHEXIDINE GLUCONATE CLOTH 2 % EX PADS
6.0000 | MEDICATED_PAD | Freq: Every day | CUTANEOUS | Status: DC
  Administered 2018-11-05: 6 via TOPICAL

## 2018-11-03 MED ORDER — PROPOFOL 10 MG/ML IV BOLUS
INTRAVENOUS | Status: AC
  Filled 2018-11-03: qty 40

## 2018-11-03 MED ORDER — CEFAZOLIN SODIUM-DEXTROSE 2-4 GM/100ML-% IV SOLN
2.0000 g | Freq: Four times a day (QID) | INTRAVENOUS | Status: DC
  Administered 2018-11-03: 2 g via INTRAVENOUS

## 2018-11-03 MED ORDER — ACETAMINOPHEN 500 MG PO TABS: 1000.0000 mg | ORAL_TABLET | Freq: Once | ORAL | Status: DC | PRN

## 2018-11-03 MED ORDER — MIDAZOLAM HCL 5 MG/5ML IJ SOLN
INTRAMUSCULAR | Status: DC | PRN
  Administered 2018-11-03 (×2): 1 mg via INTRAVENOUS

## 2018-11-03 MED ORDER — ACETAMINOPHEN 10 MG/ML IV SOLN: 1000.0000 mg | Freq: Once | INTRAVENOUS | Status: DC | PRN

## 2018-11-03 MED ORDER — AMLODIPINE BESYLATE 10 MG PO TABS
10.0000 mg | ORAL_TABLET | Freq: Every day | ORAL | Status: DC
  Administered 2018-11-04 – 2018-11-05 (×2): 10 mg via ORAL
  Filled 2018-11-03 (×2): qty 1

## 2018-11-03 MED FILL — Medication: Qty: 1 | Status: AC

## 2018-11-03 SURGICAL SUPPLY — 46 items
BAG DECANTER FOR FLEXI CONT (MISCELLANEOUS) IMPLANT
BAG ZIPLOCK 12X15 (MISCELLANEOUS) IMPLANT
BLADE SAG 18X100X1.27 (BLADE) ×2 IMPLANT
BLADE SURG SZ10 CARB STEEL (BLADE) ×4 IMPLANT
COVER PERINEAL POST (MISCELLANEOUS) ×2 IMPLANT
COVER SURGICAL LIGHT HANDLE (MISCELLANEOUS) ×2 IMPLANT
COVER WAND RF STERILE (DRAPES) IMPLANT
CUP ACET PINNACLE SECTR 56MM (Hips) ×1 IMPLANT
DERMABOND ADVANCED (GAUZE/BANDAGES/DRESSINGS) ×1
DERMABOND ADVANCED .7 DNX12 (GAUZE/BANDAGES/DRESSINGS) ×1 IMPLANT
DRAPE STERI IOBAN 125X83 (DRAPES) ×2 IMPLANT
DRAPE U-SHAPE 47X51 STRL (DRAPES) ×4 IMPLANT
DRESSING AQUACEL AG SP 3.5X10 (GAUZE/BANDAGES/DRESSINGS) ×1 IMPLANT
DRSG AQUACEL AG SP 3.5X10 (GAUZE/BANDAGES/DRESSINGS) ×2
DURAPREP 26ML APPLICATOR (WOUND CARE) ×2 IMPLANT
ELECT BLADE TIP CTD 4 INCH (ELECTRODE) ×2 IMPLANT
ELECT REM PT RETURN 15FT ADLT (MISCELLANEOUS) ×2 IMPLANT
ELIMINATOR HOLE APEX DEPUY (Hips) ×2 IMPLANT
GLOVE BIO SURGEON STRL SZ 6 (GLOVE) ×4 IMPLANT
GLOVE BIOGEL PI IND STRL 6.5 (GLOVE) ×1 IMPLANT
GLOVE BIOGEL PI IND STRL 7.5 (GLOVE) ×1 IMPLANT
GLOVE BIOGEL PI IND STRL 8.5 (GLOVE) ×1 IMPLANT
GLOVE BIOGEL PI INDICATOR 6.5 (GLOVE) ×1
GLOVE BIOGEL PI INDICATOR 7.5 (GLOVE) ×1
GLOVE BIOGEL PI INDICATOR 8.5 (GLOVE) ×1
GLOVE ECLIPSE 8.0 STRL XLNG CF (GLOVE) ×4 IMPLANT
GLOVE ORTHO TXT STRL SZ7.5 (GLOVE) ×4 IMPLANT
GOWN STRL REUS W/TWL LRG LVL3 (GOWN DISPOSABLE) ×4 IMPLANT
GOWN STRL REUS W/TWL XL LVL3 (GOWN DISPOSABLE) ×2 IMPLANT
HEAD CERAMIC 36 PLUS5 (Hips) ×2 IMPLANT
HOLDER FOLEY CATH W/STRAP (MISCELLANEOUS) ×2 IMPLANT
KIT TURNOVER KIT A (KITS) IMPLANT
PACK ANTERIOR HIP CUSTOM (KITS) ×2 IMPLANT
PINNACLE ALTRX PLUS 4 N 36X56 (Hips) ×2 IMPLANT
PINNACLE SECTOR CUP 56MM (Hips) ×2 IMPLANT
SCREW 6.5MMX25MM (Screw) ×2 IMPLANT
STEM FEMORAL SZ9 HIGH ACTIS (Stem) ×2 IMPLANT
SUT MNCRL AB 4-0 PS2 18 (SUTURE) ×2 IMPLANT
SUT STRATAFIX 0 PDS 27 VIOLET (SUTURE) ×2
SUT VIC AB 1 CT1 36 (SUTURE) ×6 IMPLANT
SUT VIC AB 2-0 CT1 27 (SUTURE) ×2
SUT VIC AB 2-0 CT1 TAPERPNT 27 (SUTURE) ×2 IMPLANT
SUTURE STRATFX 0 PDS 27 VIOLET (SUTURE) ×1 IMPLANT
TRAY FOLEY MTR SLVR 16FR STAT (SET/KITS/TRAYS/PACK) IMPLANT
WATER STERILE IRR 1000ML POUR (IV SOLUTION) ×2 IMPLANT
YANKAUER SUCT BULB TIP 10FT TU (MISCELLANEOUS) IMPLANT

## 2018-11-03 NOTE — Transfer of Care (Signed)
Immediate Anesthesia Transfer of Care Note  Patient: Martin Holt  Procedure(s) Performed: TOTAL HIP ARTHROPLASTY ANTERIOR APPROACH (Right Hip)  Patient Location: PACU  Anesthesia Type:MAC and Spinal  Level of Consciousness: awake, alert , oriented and patient cooperative  Airway & Oxygen Therapy: Patient Spontanous Breathing and Patient connected to face mask oxygen  Post-op Assessment: Report given to RN and Post -op Vital signs reviewed and stable  Post vital signs: Reviewed and stable  Last Vitals:  Vitals Value Taken Time  BP 134/72 11/03/18 1323  Temp    Pulse 66 11/03/18 1327  Resp 14 11/03/18 1327  SpO2 100 % 11/03/18 1327  Vitals shown include unvalidated device data.  Last Pain:  Vitals:   11/03/18 0922  TempSrc: Oral         Complications: No apparent anesthesia complications

## 2018-11-03 NOTE — Anesthesia Procedure Notes (Signed)
Spinal  Patient location during procedure: OR Start time: 11/03/2018 11:11 AM End time: 11/03/2018 11:18 AM Staffing Anesthesiologist: Oleta Mouse, MD Performed: anesthesiologist  Preanesthetic Checklist Completed: patient identified, surgical consent, pre-op evaluation, timeout performed, IV checked, risks and benefits discussed and monitors and equipment checked Spinal Block Patient position: sitting Prep: DuraPrep Patient monitoring: heart rate, cardiac monitor, continuous pulse ox and blood pressure Approach: midline Location: L2-3 Injection technique: single-shot Needle Needle type: Pencan  Needle gauge: 24 G Needle length: 9 cm Assessment Sensory level: T6 Additional Notes Attempt x 1 L5/S1, moved to top of scar and attempt x1 at L2/3 with free flow CSF, unable to aspirate.

## 2018-11-03 NOTE — Interval H&P Note (Signed)
History and Physical Interval Note:  11/03/2018 10:05 AM  [name]  has presented today for surgery, with the diagnosis of Osteoarthritis of hip.  The various methods of treatment have been discussed with the patient and family. After consideration of risks, benefits and other options for treatment, the patient has consented to  Procedure(s): TOTAL HIP ARTHROPLASTY ANTERIOR APPROACH (Right) as a surgical intervention.  The patient's history has been reviewed, patient examined, no change in status, stable for surgery.  I have reviewed the patient's chart and labs.  Questions were answered to the patient's satisfaction.     Martin Holt

## 2018-11-03 NOTE — Progress Notes (Signed)
Received pt from pacu.  Alert, very talkative. Wife at bedside.  After about an hour his wife called me into the room because pt was nauseated. Pt was also diaphoretic, pale, and could not keep his eyes open. cbg was 157. When I first entered the room the pulse ox showed a heart rate in the 50's. Then it began trending into the 40's. I called the rapid response nurse. Dr Alvan Dame was notified.  Shortly after the rr nurse arrived the patient began to wake up and vital signs improved.  Pt was transferred to 1405.

## 2018-11-03 NOTE — Progress Notes (Signed)
PT Cancellation Note  Patient Details Name: JONAVIN SEDER MRN: [id number] DOB: 06-30-1955   Cancelled Treatment:    Reason Eval/Treat Not Completed: Medical issues which prohibited therapy(rapid response in room for low BP and low HR. Will follow.)  Philomena Doheny PT 11/03/2018  Acute Rehabilitation Services Pager [phone] Office [phone]

## 2018-11-03 NOTE — Progress Notes (Signed)
Patient needs an order for a CPAP.  Dr. Aurea Graff answering service was notified. Awaiting a call back from the on-call PCP.

## 2018-11-03 NOTE — Anesthesia Preprocedure Evaluation (Signed)
Anesthesia Evaluation  Patient identified by MRN, date of birth, ID band Patient awake    Reviewed: Allergy & Precautions, NPO status , Patient's Chart, lab work & pertinent test results  History of Anesthesia Complications (+) PONV and history of anesthetic complications  Airway Mallampati: I  TM Distance: >3 FB Neck ROM: Full    Dental  (+) Dental Advisory Given, Teeth Intact   Pulmonary neg shortness of breath, sleep apnea and Continuous Positive Airway Pressure Ventilation , neg recent URI, former smoker,    breath sounds clear to auscultation       Cardiovascular hypertension, Pt. 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  Neuromuscular disease    GI/Hepatic Neg liver ROS, GERD  Medicated and Controlled,  Endo/Other  diabetesMorbid obesity  Renal/GU negative Renal ROS     Musculoskeletal  (+) Arthritis ,   Abdominal   Peds  Hematology negative hematology ROS (+)   Anesthesia Other Findings   Reproductive/Obstetrics                             Anesthesia Physical Anesthesia Plan  ASA: II  Anesthesia Plan: General   Post-op Pain Management:    Induction:   PONV Risk Score and Plan:   Airway Management Planned:   Additional Equipment:   Intra-op Plan:   Post-operative Plan:   Informed Consent:   Plan Discussed with:   Anesthesia Plan Comments:         Anesthesia Quick Evaluation

## 2018-11-03 NOTE — Op Note (Signed)
NAME:  Martin Holt NO.: [id number]      MEDICAL RECORD NO.: [id number]      FACILITY:  [facility]      PHYSICIAN:  Shelda Pal  DATE OF BIRTH:  30-Nov-1955     DATE OF PROCEDURE:  11/03/2018                                 OPERATIVE REPORT         PREOPERATIVE DIAGNOSIS: Right  hip osteoarthritis.      POSTOPERATIVE DIAGNOSIS:  Right hip osteoarthritis.      PROCEDURE:  Right total hip replacement through an anterior approach   utilizing DePuy THR system, component size 65mm pinnacle cup, a size 36+4 neutral   Altrex liner, a size 9 Hi Actis stem with a 36+5 delta ceramic   ball.      SURGEON:  Madlyn Frankel. Charlann Boxer, M.D.      ASSISTANT:  Dennie Bible, PA-C     ANESTHESIA:  Spinal.      SPECIMENS:  None.      COMPLICATIONS:  None.      BLOOD LOSS:  700 cc     DRAINS:  None.      INDICATION OF THE PROCEDURE:  Martin Holt is a 63 y.o. male who had   presented to office for evaluation of right hip pain.  Radiographs revealed   progressive degenerative changes with bone-on-bone   articulation of the  hip joint, including subchondral cystic changes and osteophytes.  The patient had painful limited range of   motion significantly affecting their overall quality of life and function.  The patient was failing to    respond to conservative measures including medications and/or injections and activity modification and at this point was ready   to proceed with more definitive measures.  Consent was obtained for   benefit of pain relief.  Specific risks of infection, DVT, component   failure, dislocation, neurovascular injury, and need for revision surgery were reviewed in the office as well discussion of   the anterior versus posterior approach were reviewed.     PROCEDURE IN DETAIL:  The patient was brought to operative theater.   Once adequate anesthesia, preoperative antibiotics, 2 gm of Ancef, 1 gm of Tranexamic Acid, and  10 mg of Decadron were administered, the patient was positioned supine on the [employer] table.  Once the patient was safely positioned with adequate padding of boney prominences we predraped out the hip, and used fluoroscopy to confirm orientation of the pelvis.      The right hip was then prepped and draped from proximal iliac crest to   mid thigh with a shower curtain technique.      Time-out was performed identifying the patient, planned procedure, and the appropriate extremity.     An incision was then made 2 cm lateral to the   anterior superior iliac spine extending over the orientation of the   tensor fascia lata muscle and sharp dissection was carried down to the   fascia of the muscle.      The fascia was then incised.  The muscle belly was identified and swept   laterally and retractor placed along the superior neck.  Following   cauterization of the circumflex vessels and removing some pericapsular  fat, a second cobra retractor was placed on the inferior neck.  A T-capsulotomy was made along the line of the   superior neck to the trochanteric fossa, then extended proximally and   distally.  Tag sutures were placed and the retractors were then placed   intracapsular.  We then identified the trochanteric fossa and   orientation of my neck cut and then made a neck osteotomy with the femur on traction.  The femoral   head was removed without difficulty or complication.  Traction was let   off and retractors were placed posterior and anterior around the   acetabulum.      The labrum and foveal tissue were debrided.  I began reaming with a 47 mm   reamer and reamed up to 55 mm reamer with good bony bed preparation and a 56 mm  cup was chosen.  The final 56 mm Pinnacle cup was then impacted under fluoroscopy to confirm the depth of penetration and orientation with respect to   Abduction and forward flexion.  A screw was placed into the ilium followed by the hole eliminator.  The  final   36+4 neutral Altrex liner was impacted with good visualized rim fit.  The cup was positioned anatomically within the acetabular portion of the pelvis.      At this point, the femur was rolled to 100 degrees.  Further capsule was   released off the inferior aspect of the femoral neck.  I then   released the superior capsule proximally.  With the leg in a neutral position the hook was placed laterally   along the femur under the vastus lateralis origin and elevated manually and then held in position using the hook attachment on the bed.  The leg was then extended and adducted with the leg rolled to 100   degrees of external rotation.  Retractors were placed along the medial calcar and posteriorly over the greater trochanter.  Once the proximal femur was fully   exposed, I used a box osteotome to set orientation.  I then began   broaching with the starting chili pepper broach and passed this by hand and then broached up to 9.  With the 9 broach in place I chose a high offset neck and did several trial reductions.  The offset was appropriate, leg lengths   appeared to be equal best matched with the +5 head ball trial confirmed radiographically.  He specifically asked that I try my best to lengthen his right hip.  He currently was wearing a lift on the sole of his shoe of about 1 inch.  Given these findings, I went ahead and dislocated the hip, repositioned all   retractors and positioned the right hip in the extended and abducted position.  The final 9 Hi Actis stem was   chosen and it was impacted down to the level of neck cut.  Based on this   and the trial reductions, a final 36+5 delta ceramic ball was chosen and   impacted onto a clean and dry trunnion, and the hip was reduced.  The   hip had been irrigated throughout the case again at this point.  I did   reapproximate the superior capsular leaflet to the anterior leaflet   using #1 Vicryl.  The fascia of the   tensor fascia lata muscle  was then reapproximated using #1 Vicryl and #0 Stratafix sutures.  The   remaining wound was closed with 2-0 Vicryl and running 4-0  Monocryl.   The hip was cleaned, dried, and dressed sterilely using Dermabond and   Aquacel dressing.  The patient was then brought   to recovery room in stable condition tolerating the procedure well.    Griffith Citron, PA-C was present for the entirety of the case involved from   preoperative positioning, perioperative retractor management, general   facilitation of the case, as well as primary wound closure as assistant.            Pietro Cassis Alvan Dame, M.D.        11/03/2018 11:19 AM

## 2018-11-03 NOTE — Discharge Instructions (Signed)

## 2018-11-03 NOTE — Anesthesia Procedure Notes (Signed)
Procedure Name: MAC Date/Time: 11/03/2018 11:01 AM Performed by: West Pugh, CRNA Pre-anesthesia Checklist: Patient identified, Emergency Drugs available, Suction available, Patient being monitored and Timeout performed Patient Re-evaluated:Patient Re-evaluated prior to induction Oxygen Delivery Method: Simple face mask Preoxygenation: Pre-oxygenation with 100% oxygen Induction Type: IV induction Number of attempts: 1 Placement Confirmation: positive ETCO2 Dental Injury: Teeth and Oropharynx as per pre-operative assessment

## 2018-11-04 ENCOUNTER — Encounter (HOSPITAL_COMMUNITY): Payer: Self-pay | Admitting: Orthopedic Surgery

## 2018-11-04 LAB — BASIC METABOLIC PANEL
Anion gap: 10 (ref 5–15)
BUN: 17 mg/dL (ref 8–23)
CO2: 24 mmol/L (ref 22–32)
Calcium: 8.3 mg/dL — ABNORMAL LOW (ref 8.9–10.3)
Chloride: 103 mmol/L (ref 98–111)
Creatinine, Ser: 0.9 mg/dL (ref 0.61–1.24)
GFR calc Af Amer: >60 mL/min (ref >60)
GFR calc non Af Amer: >60 mL/min (ref >60)
Glucose, Bld: 191 mg/dL — ABNORMAL HIGH (ref 70–99)
Potassium: 3.9 mmol/L (ref 3.5–5.1)
Sodium: 137 mmol/L (ref 135–145)

## 2018-11-04 LAB — CBC
HCT: 35.5 % — ABNORMAL LOW (ref 39.0–52.0)
Hemoglobin: 11.5 g/dL — ABNORMAL LOW (ref 13.0–17.0)
MCH: 28.7 pg (ref 26.0–34.0)
MCHC: 32.4 g/dL (ref 30.0–36.0)
MCV: 88.5 fL (ref 80.0–100.0)
Platelets: 259 10*3/uL (ref 150–400)
RBC: 4.01 MIL/uL — ABNORMAL LOW (ref 4.22–5.81)
RDW: 14.8 % (ref 11.5–15.5)
WBC: 13.8 10*3/uL — ABNORMAL HIGH (ref 4.0–10.5)
nRBC: 0 % (ref 0.0–0.2)

## 2018-11-04 NOTE — Progress Notes (Signed)
Physical Therapy Treatment Patient Details Name: Martin Holt MRN: [id number] DOB: 1955-10-16 Today's Date: 11/04/2018    History of Present Illness 63 y/o s/p R THA with anterior approach.  He did have decreased HR with rapid response called after surgery.  PMH: multiple back surgeries, L foot drop    PT Comments    Pt doing well. Will see tomorrow for stair training in preparation for d/c home.   Follow Up Recommendations  Follow surgeon's recommendation for DC plan and follow-up therapies     Equipment Recommendations  None recommended by PT    Recommendations for Other Services       Precautions / Restrictions Precautions Precautions: Anterior Hip;Other (comment) Restrictions Weight Bearing Restrictions: Yes RLE Weight Bearing: Weight bearing as tolerated    Mobility  Bed Mobility Overal bed mobility: Needs Assistance Bed Mobility: Sit to Supine      Sit to supine: Supervision   General bed mobility comments: Used L LE to assist R LE, cues for breathing  Transfers Overall transfer level: Needs assistance Equipment used: Rolling walker (2 wheeled) Transfers: Sit to/from Stand Sit to Stand: Min assist         General transfer comment: stood from lower surface this afternoon  Ambulation/Gait Ambulation/Gait assistance: Min guard Gait Distance (Feet): 120 Feet Assistive device: Rolling walker (2 wheeled) Gait Pattern/deviations: Decreased dorsiflexion - left;Decreased step length - right;Decreased step length - left Gait velocity: decreased   General Gait Details: Pt working on putting more weight through R LE with gait.  Good sequencing.   Stairs             Wheelchair Mobility    Modified Rankin (Stroke Patients Only)       Balance Overall balance assessment: Needs assistance   Sitting balance-Leahy Scale: Good       Standing balance-Leahy Scale: Poor Standing balance comment: requires RW for support                             Cognition Arousal/Alertness: Awake/alert Behavior During Therapy: WFL for tasks assessed/performed Overall Cognitive Status: Within Functional Limits for tasks assessed                                 General Comments: talkative      Exercises Total Joint Exercises Ankle Circles/Pumps: AROM;Right;10 reps Heel Slides: AROM;Right;5 reps    General Comments General comments (skin integrity, edema, etc.): tends to hold his breath and need cues to breathe.      Pertinent Vitals/Pain Pain Assessment: Faces Faces Pain Scale: Hurts little more Pain Location: R thigh Pain Intervention(s): Limited activity within patient's tolerance;Monitored during session    Home Living                      Prior Function            PT Goals (current goals can now be found in the care plan section) Acute Rehab PT Goals Patient Stated Goal: get back home PT Goal Formulation: With patient Time For Goal Achievement: 11/18/18 Potential to Achieve Goals: Good Progress towards PT goals: Progressing toward goals    Frequency    7X/week      PT Plan Current plan remains appropriate    Co-evaluation              AM-PAC PT "6 Clicks" Mobility  Outcome Measure  Help needed turning from your back to your side while in a flat bed without using bedrails?: A Little Help needed moving from lying on your back to sitting on the side of a flat bed without using bedrails?: A Little Help needed moving to and from a bed to a chair (including a wheelchair)?: A Little Help needed standing up from a chair using your arms (e.g., wheelchair or bedside chair)?: A Little Help needed to walk in hospital room?: A Little Help needed climbing 3-5 steps with a railing? : A Little 6 Click Score: 18    End of Session Equipment Utilized During Treatment: Gait belt Activity Tolerance: Patient tolerated treatment well Patient left: in bed;with call bell/phone within  reach Nurse Communication: Mobility status PT Visit Diagnosis: Unsteadiness on feet (R26.81);Muscle weakness (generalized) (M62.81);Difficulty in walking, not elsewhere classified (R26.2)     Time: 3235-5732 PT Time Calculation (min) (ACUTE ONLY): 19 min  Charges:  $Gait Training: 8-22 mins                     Nathalya Wolanski L. Tamala Julian, [place] Pager [phone] 11/04/2018    [name] 11/04/2018, 3:30 PM

## 2018-11-04 NOTE — Evaluation (Signed)
Physical Therapy Evaluation Patient Details Name: Martin Holt MRN: [id number] DOB: Apr 30, 1955 Today's Date: 11/04/2018   History of Present Illness  63 y/o s/p R THA with anterior approach.  He did have decreased HR with rapid response called after surgery.  PMH: multiple back surgeries, L foot drop  Clinical Impression  Pt admitted with above diagnosis.  Pt currently with functional limitations due to the deficits listed below (see PT Problem List). Pt will benefit from skilled PT to increase their independence and safety with mobility to allow discharge to the venue listed below.  Pt should progress well. He had no issues with PT and HR was stable. Wife to bring shoes in with L brace for his foot drop.     Follow Up Recommendations Follow surgeon's recommendation for DC plan and follow-up therapies    Equipment Recommendations  None recommended by PT    Recommendations for Other Services       Precautions / Restrictions Precautions Precautions: Anterior Hip;Other (comment) Precaution Comments: L foot drop Restrictions Weight Bearing Restrictions: Yes RLE Weight Bearing: Weight bearing as tolerated      Mobility  Bed Mobility Overal bed mobility: Needs Assistance Bed Mobility: Supine to Sit     Supine to sit: Min assist     General bed mobility comments: MIN A for R LE  Transfers Overall transfer level: Needs assistance Equipment used: Rolling walker (2 wheeled) Transfers: Sit to/from Stand Sit to Stand: Min assist;+2 safety/equipment;From elevated surface         General transfer comment: MIN A from elevated surface  Ambulation/Gait Ambulation/Gait assistance: Min guard;+2 safety/equipment Gait Distance (Feet): 115 Feet Assistive device: Rolling walker (2 wheeled) Gait Pattern/deviations: Decreased dorsiflexion - left;Decreased step length - right;Decreased step length - left     General Gait Details: Pt did well with R LE WBAT and L foot drop  without his L brace. HR stable.  Stairs            Wheelchair Mobility    Modified Rankin (Stroke Patients Only)       Balance Overall balance assessment: Needs assistance   Sitting balance-Leahy Scale: Good       Standing balance-Leahy Scale: Poor Standing balance comment: requires RW for support                             Pertinent Vitals/Pain Pain Assessment: 0-10 Pain Score: 1  Pain Location: R thigh Pain Descriptors / Indicators: Dull Pain Intervention(s): Limited activity within patient's tolerance;Monitored during session;Repositioned    Home Living Family/patient expects to be discharged to:: Private residence Living Arrangements: Spouse/significant other Available Help at Discharge: Available 24 hours/day Type of Home: House Home Access: Stairs to enter Entrance Stairs-Rails: None Entrance Stairs-Number of Steps: 2 Home Layout: One level Home Equipment: Environmental consultant - 2 wheels;Cane - single point      Prior Function Level of Independence: Independent with assistive device(s)         Comments: cane for stability due to foot drop.  Would use in the house sometimes and furniture cruised as well     Hand Dominance        Extremity/Trunk Assessment   Upper Extremity Assessment Upper Extremity Assessment: Overall WFL for tasks assessed    Lower Extremity Assessment Lower Extremity Assessment: LLE deficits/detail;RLE deficits/detail RLE Deficits / Details: ROM limited 25-50% LLE Deficits / Details: L foot drop    Cervical / Trunk Assessment Cervical /  Trunk Assessment: Normal  Communication   Communication: No difficulties  Cognition Arousal/Alertness: Awake/alert Behavior During Therapy: WFL for tasks assessed/performed Overall Cognitive Status: Within Functional Limits for tasks assessed                                 General Comments: talkative      General Comments General comments (skin integrity, edema,  etc.): tends to hold his breath and need cues to breathe.    Exercises Total Joint Exercises Ankle Circles/Pumps: AROM;Right;10 reps Heel Slides: AROM;Right;5 reps   Assessment/Plan    PT Assessment Patient needs continued PT services  PT Problem List Decreased strength;Decreased range of motion;Decreased activity tolerance;Decreased balance;Decreased mobility       PT Treatment Interventions DME instruction;Gait training;Functional mobility training;Stair training;Therapeutic exercise;Balance training;Therapeutic activities;Patient/family education    PT Goals (Current goals can be found in the Care Plan section)  Acute Rehab PT Goals Patient Stated Goal: get back home PT Goal Formulation: With patient Time For Goal Achievement: 11/18/18 Potential to Achieve Goals: Good    Frequency 7X/week   Barriers to discharge        Co-evaluation               AM-PAC PT "6 Clicks" Mobility  Outcome Measure Help needed turning from your back to your side while in a flat bed without using bedrails?: A Little Help needed moving from lying on your back to sitting on the side of a flat bed without using bedrails?: A Little Help needed moving to and from a bed to a chair (including a wheelchair)?: A Little Help needed standing up from a chair using your arms (e.g., wheelchair or bedside chair)?: A Little Help needed to walk in hospital room?: A Little Help needed climbing 3-5 steps with a railing? : A Little 6 Click Score: 18    End of Session Equipment Utilized During Treatment: Gait belt Activity Tolerance: Patient tolerated treatment well Patient left: in chair;with call bell/phone within reach;with chair alarm set Nurse Communication: Mobility status PT Visit Diagnosis: Unsteadiness on feet (R26.81);Muscle weakness (generalized) (M62.81);Difficulty in walking, not elsewhere classified (R26.2)    Time: 0865-7846 PT Time Calculation (min) (ACUTE ONLY): 36 min   Charges:    PT Evaluation $PT Eval Moderate Complexity: 1 Mod PT Treatments $Gait Training: 8-22 mins        Martin Holt, [place] Pager [phone] 11/04/2018   Martin Holt 11/04/2018, 12:03 PM

## 2018-11-04 NOTE — Plan of Care (Signed)
  Problem: Education: Goal: Knowledge of the prescribed therapeutic regimen will improve Outcome: Progressing Goal: Understanding of discharge needs will improve Outcome: Progressing   Problem: Activity: Goal: Ability to avoid complications of mobility impairment will improve Outcome: Progressing Goal: Ability to tolerate increased activity will improve Outcome: Progressing   Problem: Clinical Measurements: Goal: Postoperative complications will be avoided or minimized Outcome: Progressing   Problem: Pain Management: Goal: Pain level will decrease with appropriate interventions Outcome: Progressing   Problem: Skin Integrity: Goal: Will show signs of wound healing Outcome: Progressing   

## 2018-11-04 NOTE — Progress Notes (Signed)
Rapid response called to room. When arrived in room, patient diaphoretic, clammy, cool, pale. Patient arouses to painful stimuli. Patient HR 40 on monitor.  Code cart brought to bedside, including emergency medications, AED pads placed on patient.   Patients HR increased to 60 (pts baseline) and A&Ox4.   Paged Dr. Alvan Dame, transfer to telemetry order. RRT Remained with patient and transfer to room 1405.

## 2018-11-04 NOTE — Progress Notes (Signed)
     Subjective: 1 Day Post-Op Procedure(s) (LRB): TOTAL HIP ARTHROPLASTY ANTERIOR APPROACH (Right)   Patient reports pain as mild, pain controlled. Yesterday he had a possible syncopal event which scared the patient and his wife.  He has never had anything like this happen before.  We have discussed the procedure, findings and expectations moving forward.  Due to the event yesterday we will keep him here today and make sure he is stable.  Discharge home probably tomorrow as long as he is stable and doing well.  Plan for discharge tomorrow also due to pain control and need for inpatient therapy to meet goal of being discharged home safely with family/caregiver.     Objective:   VITALS:   Vitals:   11/04/18 0610 11/04/18 0613  BP: 132/67 124/76  Pulse: 83 84  Resp:    Temp:    SpO2:      Dorsiflexion/Plantar flexion intact Incision: dressing C/D/I No cellulitis present Compartment soft  LABS Recent Labs    11/04/18 0513  HGB 11.5*  HCT 35.5*  WBC 13.8*  PLT 259    Recent Labs    11/04/18 0513  NA 137  K 3.9  BUN 17  CREATININE 0.90  GLUCOSE 191*     Assessment/Plan: 1 Day Post-Op Procedure(s) (LRB): TOTAL HIP ARTHROPLASTY ANTERIOR APPROACH (Right) Foley cath d/c'ed Advance diet Up with therapy D/C IV fluids Discharge home, when ready and make sure he is stable  Obese (BMI 30-39.9) Estimated body mass index is 35.49 kg/m as calculated from the following:   Height as of this encounter: 6\' 1"  (1.854 m).   Weight as of this encounter: 122 kg. Patient also counseled that weight may inhibit the healing process Patient counseled that losing weight will help with future health issues       West Pugh. Martin Holt   PAC  11/04/2018, 10:00 AM

## 2018-11-05 DIAGNOSIS — Z79890 Hormone replacement therapy (postmenopausal): Secondary | ICD-10-CM | POA: Diagnosis not present

## 2018-11-05 DIAGNOSIS — Z981 Arthrodesis status: Secondary | ICD-10-CM | POA: Diagnosis not present

## 2018-11-05 DIAGNOSIS — M1611 Unilateral primary osteoarthritis, right hip: Secondary | ICD-10-CM | POA: Diagnosis present

## 2018-11-05 DIAGNOSIS — Z791 Long term (current) use of non-steroidal anti-inflammatories (NSAID): Secondary | ICD-10-CM | POA: Diagnosis not present

## 2018-11-05 DIAGNOSIS — Z885 Allergy status to narcotic agent status: Secondary | ICD-10-CM | POA: Diagnosis not present

## 2018-11-05 DIAGNOSIS — K219 Gastro-esophageal reflux disease without esophagitis: Secondary | ICD-10-CM | POA: Diagnosis present

## 2018-11-05 DIAGNOSIS — E669 Obesity, unspecified: Secondary | ICD-10-CM | POA: Diagnosis present

## 2018-11-05 DIAGNOSIS — G4730 Sleep apnea, unspecified: Secondary | ICD-10-CM | POA: Diagnosis present

## 2018-11-05 DIAGNOSIS — I10 Essential (primary) hypertension: Secondary | ICD-10-CM | POA: Diagnosis present

## 2018-11-05 DIAGNOSIS — Z6835 Body mass index (BMI) 35.0-35.9, adult: Secondary | ICD-10-CM | POA: Diagnosis not present

## 2018-11-05 DIAGNOSIS — Z87891 Personal history of nicotine dependence: Secondary | ICD-10-CM | POA: Diagnosis not present

## 2018-11-05 DIAGNOSIS — E119 Type 2 diabetes mellitus without complications: Secondary | ICD-10-CM | POA: Diagnosis present

## 2018-11-05 DIAGNOSIS — Z79899 Other long term (current) drug therapy: Secondary | ICD-10-CM | POA: Diagnosis not present

## 2018-11-05 DIAGNOSIS — M21372 Foot drop, left foot: Secondary | ICD-10-CM | POA: Diagnosis present

## 2018-11-05 DIAGNOSIS — Z96641 Presence of right artificial hip joint: Secondary | ICD-10-CM

## 2018-11-05 DIAGNOSIS — G709 Myoneural disorder, unspecified: Secondary | ICD-10-CM | POA: Diagnosis present

## 2018-11-05 LAB — CBC
HCT: 34.9 % — ABNORMAL LOW (ref 39.0–52.0)
Hemoglobin: 11.2 g/dL — ABNORMAL LOW (ref 13.0–17.0)
MCH: 28.7 pg (ref 26.0–34.0)
MCHC: 32.1 g/dL (ref 30.0–36.0)
MCV: 89.5 fL (ref 80.0–100.0)
Platelets: 249 10*3/uL (ref 150–400)
RBC: 3.9 MIL/uL — ABNORMAL LOW (ref 4.22–5.81)
RDW: 15.2 % (ref 11.5–15.5)
WBC: 16.8 10*3/uL — ABNORMAL HIGH (ref 4.0–10.5)
nRBC: 0 % (ref 0.0–0.2)

## 2018-11-05 LAB — BASIC METABOLIC PANEL
Anion gap: 9 (ref 5–15)
BUN: 13 mg/dL (ref 8–23)
CO2: 26 mmol/L (ref 22–32)
Calcium: 9.1 mg/dL (ref 8.9–10.3)
Chloride: 101 mmol/L (ref 98–111)
Creatinine, Ser: 0.83 mg/dL (ref 0.61–1.24)
GFR calc Af Amer: >60 mL/min (ref >60)
GFR calc non Af Amer: >60 mL/min (ref >60)
Glucose, Bld: 140 mg/dL — ABNORMAL HIGH (ref 70–99)
Potassium: 4.4 mmol/L (ref 3.5–5.1)
Sodium: 136 mmol/L (ref 135–145)

## 2018-11-05 MED ORDER — TIZANIDINE HCL 4 MG PO TABS: 4.0000 mg | ORAL_TABLET | Freq: Four times a day (QID) | ORAL | 0 refills | Status: AC | PRN

## 2018-11-05 MED ORDER — POLYETHYLENE GLYCOL 3350 17 G PO PACK: 17.0000 g | PACK | Freq: Two times a day (BID) | ORAL | 0 refills | Status: AC

## 2018-11-05 MED ORDER — HYDROCODONE-ACETAMINOPHEN 7.5-325 MG PO TABS: 1.0000 | ORAL_TABLET | ORAL | 0 refills | Status: AC | PRN

## 2018-11-05 MED ORDER — ASPIRIN 81 MG PO CHEW: 81.0000 mg | CHEWABLE_TABLET | Freq: Two times a day (BID) | ORAL | 0 refills | Status: AC

## 2018-11-05 MED ORDER — DOCUSATE SODIUM 100 MG PO CAPS: 100.0000 mg | ORAL_CAPSULE | Freq: Two times a day (BID) | ORAL | 0 refills | Status: AC

## 2018-11-05 MED ORDER — FERROUS SULFATE 325 (65 FE) MG PO TABS: 325.0000 mg | ORAL_TABLET | Freq: Three times a day (TID) | ORAL | 0 refills | Status: AC

## 2018-11-05 NOTE — Progress Notes (Signed)
     Subjective: 2 Days Post-Op Procedure(s) (LRB): TOTAL HIP ARTHROPLASTY ANTERIOR APPROACH (Right)   Patient reports pain as mild, pain controlled.  No events throughout the night.  Feels that he is doing well and comfortable to go home.  Patient is ready to be discharged, if he does well with PT. He knows to call with any questions or concerns.  He will follow up in the clinic in 2 weeks.     Objective:   VITALS:   Vitals:   11/04/18 2155 11/05/18 0512  BP: (!) 142/70 (!) 155/77  Pulse: 78 77  Resp: 20 18  Temp: 99.2 F (37.3 C) 98.9 F (37.2 C)  SpO2: 95% 98%    Dorsiflexion/Plantar flexion intact Incision: dressing C/D/I No cellulitis present Compartment soft  LABS Recent Labs    11/04/18 0513 11/05/18 0456  HGB 11.5* 11.2*  HCT 35.5* 34.9*  WBC 13.8* 16.8*  PLT 259 249    Recent Labs    11/04/18 0513 11/05/18 0456  NA 137 136  K 3.9 4.4  BUN 17 13  CREATININE 0.90 0.83  GLUCOSE 191* 140*     Assessment/Plan: 2 Days Post-Op Procedure(s) (LRB): TOTAL HIP ARTHROPLASTY ANTERIOR APPROACH (Right) Up with therapy Discharge home Follow up in 2 weeks at [facility] Follow up with OLIN,Karelly Dewalt D in 2 weeks.  Contact information:  [facility]) [address], [place] [phone]    Obese (BMI 30-39.9) Estimated body mass index is 35.49 kg/m as calculated from the following:   Height as of this encounter: 6\' 1"  (1.854 m).   Weight as of this encounter: 122 kg. Patient also counseled that weight may inhibit the healing process Patient counseled that losing weight will help with future health issues        West Pugh. Aniko Finnigan   PAC  11/05/2018, 8:23 AM

## 2018-11-05 NOTE — Progress Notes (Signed)
Physical Therapy Treatment Patient Details Name: Martin Holt MRN: [id number] DOB: 1955/07/06 Today's Date: 11/05/2018    History of Present Illness 63 y/o s/p R THA with anterior approach.  He did have decreased HR with rapid response called after surgery.  PMH: multiple back surgeries, L foot drop    PT Comments    Pt ambulated in hallway and practiced safe step technique.  Pt also performed standing and sitting exercises and provided with HEP handout.  Pt had no further questions and feels ready for d/c home today.   Follow Up Recommendations  Follow surgeon's recommendation for DC plan and follow-up therapies     Equipment Recommendations  None recommended by PT    Recommendations for Other Services       Precautions / Restrictions Precautions Precautions: None Precaution Comments: L foot drop Restrictions RLE Weight Bearing: Weight bearing as tolerated    Mobility  Bed Mobility Overal bed mobility: Needs Assistance Bed Mobility: Supine to Sit     Supine to sit: Min guard     General bed mobility comments: Used L LE to assist R LE  Transfers Overall transfer level: Needs assistance Equipment used: Rolling walker (2 wheeled) Transfers: Sit to/from Stand Sit to Stand: Min guard         General transfer comment: verbal cues for UE and LE positionining  Ambulation/Gait Ambulation/Gait assistance: Min guard Gait Distance (Feet): 150 Feet Assistive device: Rolling walker (2 wheeled) Gait Pattern/deviations: Decreased dorsiflexion - left;Step-to pattern     General Gait Details: pt able to ambulate without AFO however performed slow pace for safety   Stairs Stairs: Yes Stairs assistance: Min guard Stair Management: Step to pattern;Backwards;With walker Number of Stairs: 1 General stair comments: verbal cues for safety and sequence; pt performed twice and reports understanding  (pt states he has 2 steps however they are deep so it's more like one  step and then another step)   Wheelchair Mobility    Modified Rankin (Stroke Patients Only)       Balance                                            Cognition Arousal/Alertness: Awake/alert Behavior During Therapy: WFL for tasks assessed/performed Overall Cognitive Status: Within Functional Limits for tasks assessed                                 General Comments: talkative      Exercises Total Joint Exercises Hip ABduction/ADduction: AROM;Right;10 reps;Standing Long Arc Quad: AROM;Right;10 reps;Seated Knee Flexion: AROM;Standing;Right;10 reps Marching in Standing: AROM;Right;10 reps;Standing Standing Hip Extension: AROM;Right;Standing;10 reps    General Comments        Pertinent Vitals/Pain Pain Assessment: 0-10 Pain Score: 3  Pain Location: R thigh Pain Descriptors / Indicators: Sore Pain Intervention(s): Monitored during session;Repositioned    Home Living                      Prior Function            PT Goals (current goals can now be found in the care plan section) Progress towards PT goals: Progressing toward goals    Frequency    7X/week      PT Plan Current plan remains appropriate    Co-evaluation  AM-PAC PT "6 Clicks" Mobility   Outcome Measure  Help needed turning from your back to your side while in a flat bed without using bedrails?: A Little Help needed moving from lying on your back to sitting on the side of a flat bed without using bedrails?: A Little Help needed moving to and from a bed to a chair (including a wheelchair)?: A Little Help needed standing up from a chair using your arms (e.g., wheelchair or bedside chair)?: A Little Help needed to walk in hospital room?: A Little Help needed climbing 3-5 steps with a railing? : A Little 6 Click Score: 18    End of Session Equipment Utilized During Treatment: Gait belt Activity Tolerance: Patient tolerated treatment  well Patient left: with call bell/phone within reach;in chair Nurse Communication: Mobility status PT Visit Diagnosis: Other abnormalities of gait and mobility (R26.89)     Time: 0174-9449 PT Time Calculation (min) (ACUTE ONLY): 29 min  Charges:  $Gait Training: 8-22 mins $Therapeutic Exercise: 8-22 mins                    Carmelia Bake, PT, DPT Acute Rehabilitation Services Office: [phone] Pager: [place] E 11/05/2018, 12:31 PM

## 2018-11-11 NOTE — Discharge Summary (Signed)
Physician Discharge Summary  Patient ID: Martin Holt MRN: [id number] DOB/AGE: Aug 18, 1955 63 y.o.  Admit date: 11/03/2018 Discharge date: 11/05/2018   Procedures:  Procedure(s) (LRB): TOTAL HIP ARTHROPLASTY ANTERIOR APPROACH (Right)  Attending Physician:  Dr. Durene Romans   Admission Diagnoses:   Right hip primary OA / pain  Discharge Diagnoses:  Active Problems:   S/P right THA, AA   Obese   Status post total hip replacement, right  Past Medical History:  Diagnosis Date   Arthritis    Diabetes mellitus without complication (HCC)    type 2 no meds   Family history of adverse reaction to anesthesia    GERD (gastroesophageal reflux disease)    Hypertension    Neuromuscular disorder (HCC)    foot drop, loss feeling left foot  can not wiggle toes or lift foot   PONV (postoperative nausea and vomiting)    Sleep apnea    cpap    HPI:    Martin Holt, 63 y.o. male, has a history of pain and functional disability in the right hip(s) due to arthritis and patient has failed non-surgical conservative treatments for greater than 12 weeks to include NSAID's and/or analgesics, use of assistive devices and activity modification.  Onset of symptoms was gradual starting 2+ years ago with gradually worsening course since that time.The patient noted prior procedures of the hip to include SCFE on the right hip(s).  Patient currently rates pain in the right hip at 9 out of 10 with activity. Patient has night pain, worsening of pain with activity and weight bearing, trendelenberg gait, pain that interfers with activities of daily living and pain with passive range of motion. Patient has evidence of periarticular osteophytes and joint space narrowing by imaging studies. This condition presents safety issues increasing the risk of falls.  There is no current active infection.   Risks, benefits and expectations were discussed with the patient.  Risks including but not limited to the  risk of anesthesia, blood clots, nerve damage, blood vessel damage, failure of the prosthesis, infection and up to and including death.  Patient understand the risks, benefits and expectations and wishes to proceed with surgery.   PCP: Clemencia Course, PA-C   Discharged Condition: good  Hospital Course:  Patient underwent the above stated procedure on 11/03/2018. Patient tolerated the procedure well and brought to the recovery room in good condition and subsequently to the floor.  POD #1 BP: 124/76 ; Pulse: 84 Patient reports pain as mild, pain controlled. Yesterday he had a possible syncopal event which scared the patient and his wife.  He has never had anything like this happen before.  We have discussed the procedure, findings and expectations moving forward.  Due to the event yesterday we will keep him here today and make sure he is stable.  Discharge home probably tomorrow as long as he is stable and doing well.  Plan for discharge tomorrowalso due to pain control and need for inpatient therapy to meet goal of being discharged home safely with family/caregiver. Dorsiflexion/plantar flexion intact, incision: dressing C/D/I, no cellulitis present and compartment soft.   LABS  Basename    HGB     11.5  HCT     35.5   POD #2  BP: 155/77 ; Pulse: 77 ; Temp: 98.9 F (37.2 C) ; Resp: 18 Patient reports pain as mild, pain controlled.  No events throughout the night.  Feels that he is doing well and comfortable to go home.  Patient is ready to be discharged. Dorsiflexion/plantar flexion intact, incision: dressing C/D/I, no cellulitis present and compartment soft.   LABS  Basename    HGB     11.2  HCT     34.9    Discharge Exam: General appearance: alert, cooperative and no distress Extremities: Homans sign is negative, no sign of DVT, no edema, redness or tenderness in the calves or thighs and no ulcers, gangrene or trophic changes  Disposition:  Home with follow up in 2  weeks   Follow-up Information    Durene Romanslin, Marnie Fazzino, MD. Schedule an appointment as soon as possible for a visit in 2 weeks.   Specialty: Orthopedic Surgery Contact information: [address] [place] [address] [phone]           Discharge Instructions    Call MD / Call 911   Complete by: As directed    If you experience chest pain or shortness of breath, CALL 911 and be transported to the hospital emergency room.  If you develope a fever above 101 F, pus (white drainage) or increased drainage or redness at the wound, or calf pain, call your surgeon's office.   Change dressing   Complete by: As directed    Maintain surgical dressing until follow up in the clinic. If the edges start to pull up, may reinforce with tape. If the dressing is no longer working, may remove and cover with gauze and tape, but must keep the area dry and clean.  Call with any questions or concerns.   Constipation Prevention   Complete by: As directed    Drink plenty of fluids.  Prune juice may be helpful.  You may use a stool softener, such as Colace (over the counter) 100 mg twice a day.  Use MiraLax (over the counter) for constipation as needed.   Diet - low sodium heart healthy   Complete by: As directed    Discharge instructions   Complete by: As directed    Maintain surgical dressing until follow up in the clinic. If the edges start to pull up, may reinforce with tape. If the dressing is no longer working, may remove and cover with gauze and tape, but must keep the area dry and clean.  [facility]. Call with any questions or concerns.   Increase activity slowly as tolerated   Complete by: As directed    Weight bearing as tolerated with assist device (walker, cane, etc) as directed, use it as long as suggested by your surgeon or therapist, typically at least 4-6 weeks.   TED hose   Complete by: As directed    Use stockings (TED hose) for 2 weeks on  both leg(s).  You may remove them at night for sleeping.      Allergies as of 11/05/2018      Reactions   Codeine Nausea Only      Medication List    STOP taking these medications   cyclobenzaprine 10 MG tablet Commonly known as: FLEXERIL   meloxicam 15 MG tablet Commonly known as: MOBIC     TAKE these medications   amLODipine 10 MG tablet Commonly known as: NORVASC Take 10 mg by mouth daily.   aspirin 81 MG chewable tablet Commonly known as: Aspirin Childrens Chew 1 tablet (81 mg total) by mouth 2 (two) times daily. Take for 4 weeks, then resume regular dose.   buPROPion 150 MG 24 hr tablet Commonly known as: WELLBUTRIN  XL Take 150 mg by mouth daily.   cholecalciferol 25 MCG (1000 UT) tablet Commonly known as: VITAMIN D3 Take 4,000 Units by mouth daily.   docusate sodium 100 MG capsule Commonly known as: Colace Take 1 capsule (100 mg total) by mouth 2 (two) times daily.   ferrous sulfate 325 (65 FE) MG tablet Commonly known as: FerrouSul Take 1 tablet (325 mg total) by mouth 3 (three) times daily with meals for 14 days.   gabapentin 800 MG tablet Commonly known as: NEURONTIN Take 800 mg by mouth 4 (four) times daily.   hydrochlorothiazide 25 MG tablet Commonly known as: HYDRODIURIL Take 25 mg by mouth daily.   HYDROcodone-acetaminophen 7.5-325 MG tablet Commonly known as: Norco Take 1-2 tablets by mouth every 4 (four) hours as needed for moderate pain. What changed: how much to take   omeprazole 20 MG capsule Commonly known as: PRILOSEC Take 20 mg by mouth daily.   polyethylene glycol 17 g packet Commonly known as: MIRALAX / GLYCOLAX Take 17 g by mouth 2 (two) times daily.   quinapril 40 MG tablet Commonly known as: ACCUPRIL Take 40 mg by mouth daily.   testosterone cypionate 200 MG/ML injection Commonly known as: DEPOTESTOSTERONE CYPIONATE Inject 100 mg into the muscle every 14 (fourteen) days.   tiZANidine 4 MG tablet Commonly known as:  ZANAFLEX Take 1 tablet (4 mg total) by mouth every 6 (six) hours as needed. What changed:   when to take this  reasons to take this   vitamin C 1000 MG tablet Take 1,000 mg by mouth daily.            Discharge Care Instructions  (From admission, onward)         Start     Ordered   11/05/18 0000  Change dressing    Comments: Maintain surgical dressing until follow up in the clinic. If the edges start to pull up, may reinforce with tape. If the dressing is no longer working, may remove and cover with gauze and tape, but must keep the area dry and clean.  Call with any questions or concerns.   11/05/18 1654           Signed: West Pugh. Elo Marmolejos   PA-C  11/11/2018, 9:01 AM

## 2018-11-12 NOTE — Anesthesia Postprocedure Evaluation (Signed)
Anesthesia Post Note  Patient: DEDRICK HEFFNER  Procedure(s) Performed: TOTAL HIP ARTHROPLASTY ANTERIOR APPROACH (Right Hip)     Patient location during evaluation: PACU Anesthesia Type: General Level of consciousness: awake and alert Pain management: pain level controlled Vital Signs Assessment: post-procedure vital signs reviewed and stable Respiratory status: spontaneous breathing, nonlabored ventilation, respiratory function stable and patient connected to nasal cannula oxygen Cardiovascular status: blood pressure returned to baseline and stable Postop Assessment: no apparent nausea or vomiting Anesthetic complications: no    Last Vitals:  Vitals:   11/05/18 0512 11/05/18 1000  BP: (!) 155/77 137/62  Pulse: 77 79  Resp: 18   Temp: 37.2 C 37.2 C  SpO2: 98% 95%    Last Pain:  Vitals:   11/05/18 1820  TempSrc:   PainSc: 5                  Yarelie Hams

## 2020-06-21 IMAGING — DX DG PORTABLE PELVIS
1 series · 1 of 1 positions shown · non-contrast
Comparison: None.

CLINICAL DATA: Right hip osteoarthritis.

EXAM:
PORTABLE PELVIS 1-2 VIEWS; OPERATIVE RIGHT HIP WITH PELVIS

[pelvis ap]
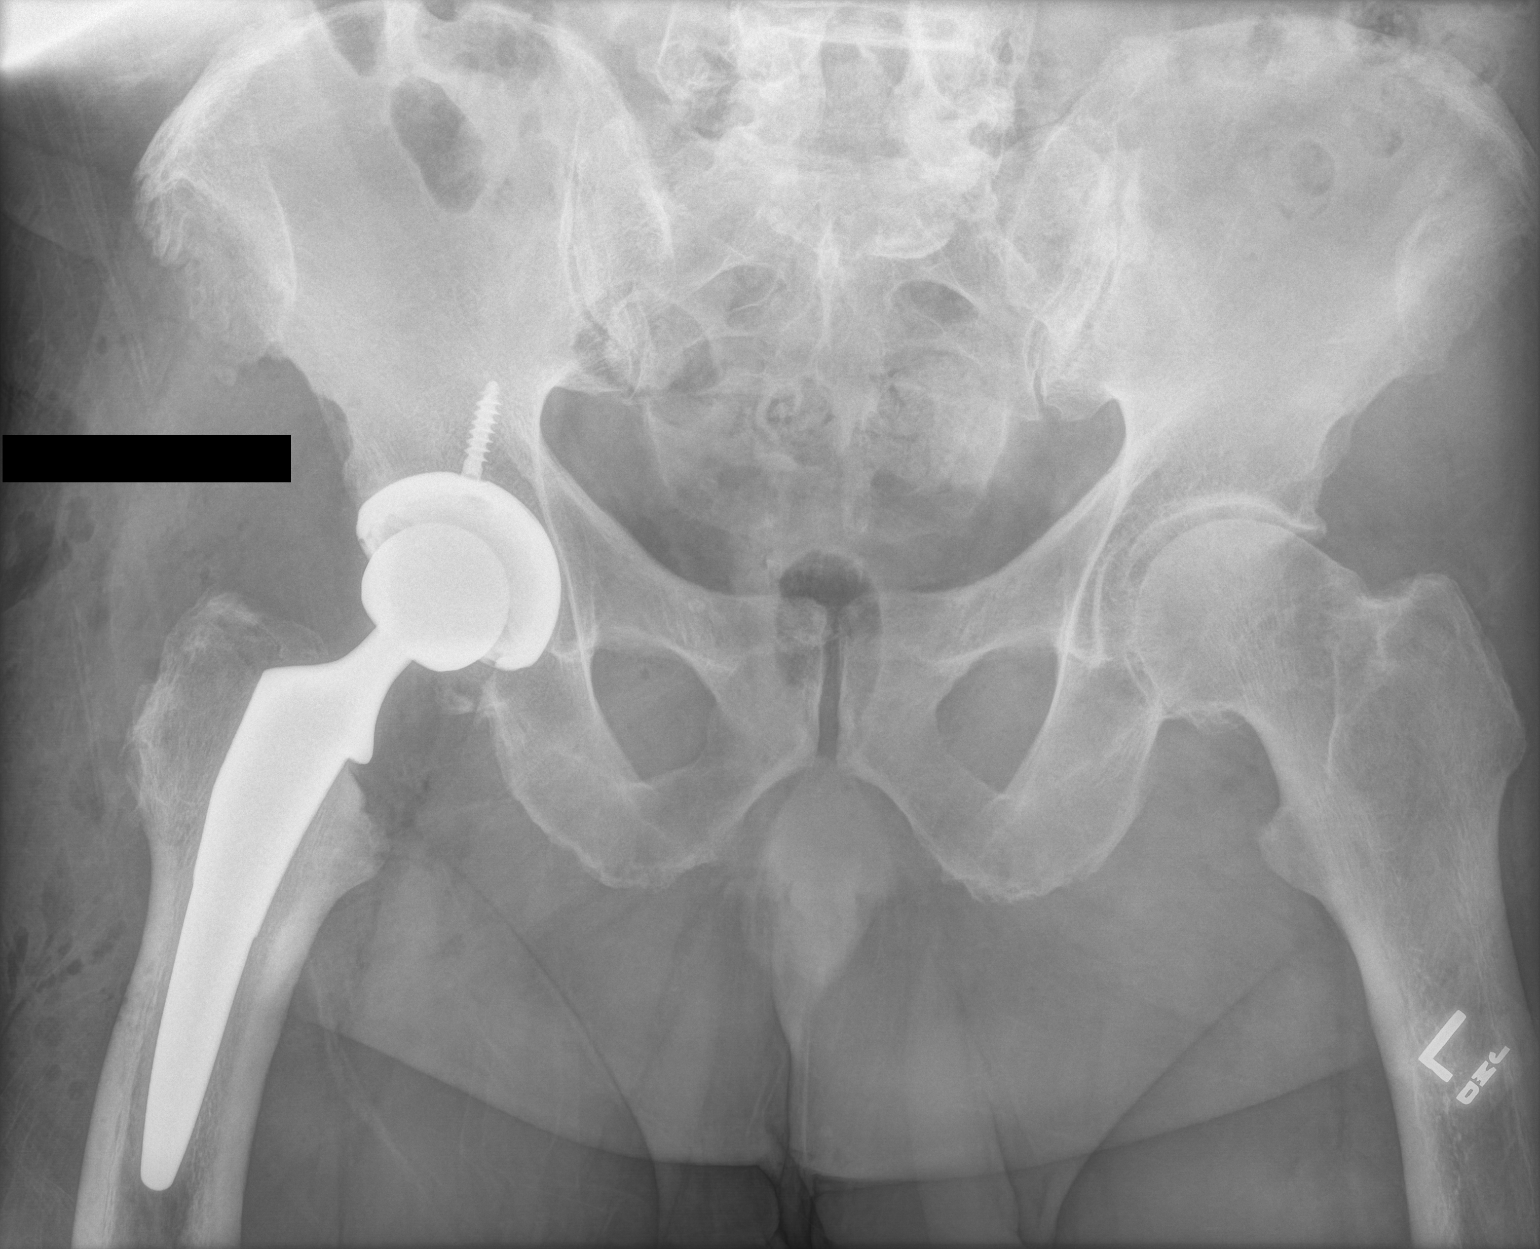

[1 of 1 positions shown; findings below may reference images not displayed]

FINDINGS: AP view of the pelvis demonstrates that the acetabular and femoral
components of the right total hip prosthesis appear in excellent
position. No fractures. Moderate arthritis of the left hip.
IMPRESSION: Satisfactory appearance of the right hip prosthesis.

## 2021-05-16 ENCOUNTER — Other Ambulatory Visit: Payer: Self-pay | Admitting: Pain Medicine

## 2021-05-16 DIAGNOSIS — M5416 Radiculopathy, lumbar region: Secondary | ICD-10-CM

## 2021-06-07 ENCOUNTER — Ambulatory Visit
Admission: RE | Admit: 2021-06-07 | Discharge: 2021-06-07 | Disposition: A | Discharge status: Home / Self Care | Payer: Medicare HMO | Source: Ambulatory Visit | Attending: Pain Medicine | Admitting: Pain Medicine

## 2021-06-07 DIAGNOSIS — M5416 Radiculopathy, lumbar region: Secondary | ICD-10-CM

## 2021-06-07 MED ORDER — GADOBENATE DIMEGLUMINE 529 MG/ML IV SOLN
20.0000 mL | Freq: Once | INTRAVENOUS | Status: AC | PRN
  Administered 2021-06-07: 20 mL via INTRAVENOUS

## 2023-05-31 ENCOUNTER — Other Ambulatory Visit: Payer: Self-pay | Admitting: Medical Genetics

## 2023-06-24 ENCOUNTER — Other Ambulatory Visit (HOSPITAL_COMMUNITY)
Admission: RE | Admit: 2023-06-24 | Discharge: 2023-06-24 | Disposition: A | Discharge status: Home / Self Care | Payer: Self-pay | Source: Ambulatory Visit | Attending: Oncology | Admitting: Oncology
